# Patient Record
Sex: Female | Born: 1955 | Race: White | Hispanic: No | Marital: Single | Smoking: Current every day smoker
Health system: Southern US, Community
[De-identification: ages and names within clinical notes are randomized; demographics above are authoritative.]

## PROBLEM LIST (undated history)

## (undated) DIAGNOSIS — F329 Major depressive disorder, single episode, unspecified: Secondary | ICD-10-CM

## (undated) DIAGNOSIS — E785 Hyperlipidemia, unspecified: Secondary | ICD-10-CM

## (undated) DIAGNOSIS — F32A Depression, unspecified: Secondary | ICD-10-CM

## (undated) DIAGNOSIS — T7840XA Allergy, unspecified, initial encounter: Secondary | ICD-10-CM

## (undated) HISTORY — PX: TUBAL LIGATION: SHX77

## (undated) HISTORY — DX: Depression, unspecified: F32.A

## (undated) HISTORY — DX: Allergy, unspecified, initial encounter: T78.40XA

## (undated) HISTORY — DX: Major depressive disorder, single episode, unspecified: F32.9

## (undated) HISTORY — PX: WISDOM TOOTH EXTRACTION: SHX21

## (undated) HISTORY — DX: Hyperlipidemia, unspecified: E78.5

---

## 2009-03-05 ENCOUNTER — Encounter: Payer: Self-pay | Admitting: Family Medicine

## 2009-10-13 ENCOUNTER — Encounter: Payer: Self-pay | Admitting: Family Medicine

## 2009-10-13 LAB — CONVERTED CEMR LAB

## 2010-11-22 ENCOUNTER — Other Ambulatory Visit: Payer: Self-pay | Admitting: Family Medicine

## 2010-11-22 ENCOUNTER — Other Ambulatory Visit (HOSPITAL_COMMUNITY)
Admission: RE | Admit: 2010-11-22 | Discharge: 2010-11-22 | Disposition: A | Discharge status: Home / Self Care | Payer: Managed Care, Other (non HMO) | Source: Ambulatory Visit | Attending: Family Medicine | Admitting: Family Medicine

## 2010-11-22 ENCOUNTER — Ambulatory Visit (INDEPENDENT_AMBULATORY_CARE_PROVIDER_SITE_OTHER): Payer: Managed Care, Other (non HMO) | Admitting: Family Medicine

## 2010-11-22 ENCOUNTER — Encounter: Payer: Self-pay | Admitting: Family Medicine

## 2010-11-22 DIAGNOSIS — Z136 Encounter for screening for cardiovascular disorders: Secondary | ICD-10-CM

## 2010-11-22 DIAGNOSIS — Z1231 Encounter for screening mammogram for malignant neoplasm of breast: Secondary | ICD-10-CM

## 2010-11-22 DIAGNOSIS — Z124 Encounter for screening for malignant neoplasm of cervix: Secondary | ICD-10-CM | POA: Insufficient documentation

## 2010-11-22 DIAGNOSIS — F329 Major depressive disorder, single episode, unspecified: Secondary | ICD-10-CM | POA: Insufficient documentation

## 2010-11-22 DIAGNOSIS — Z0000 Encounter for general adult medical examination without abnormal findings: Secondary | ICD-10-CM

## 2010-11-22 DIAGNOSIS — F3289 Other specified depressive episodes: Secondary | ICD-10-CM | POA: Insufficient documentation

## 2010-11-22 DIAGNOSIS — F17200 Nicotine dependence, unspecified, uncomplicated: Secondary | ICD-10-CM | POA: Insufficient documentation

## 2010-11-22 DIAGNOSIS — J309 Allergic rhinitis, unspecified: Secondary | ICD-10-CM | POA: Insufficient documentation

## 2010-11-22 DIAGNOSIS — Z1159 Encounter for screening for other viral diseases: Secondary | ICD-10-CM | POA: Insufficient documentation

## 2010-11-22 LAB — BASIC METABOLIC PANEL
BUN: 5 mg/dL — ABNORMAL LOW (ref 6–23)
CO2: 28 mEq/L (ref 19–32)
Calcium: 8.7 mg/dL (ref 8.4–10.5)
Chloride: 101 mEq/L (ref 96–112)
Creatinine, Ser: 0.8 mg/dL (ref 0.4–1.2)
GFR: 85.92 mL/min (ref >60.00)
Glucose, Bld: 79 mg/dL (ref 70–99)
Potassium: 4 mEq/L (ref 3.5–5.1)
Sodium: 138 mEq/L (ref 135–145)

## 2010-11-22 LAB — LIPID PANEL
Cholesterol: 190 mg/dL (ref 0–200)
HDL: 49.2 mg/dL (ref >39.00)
LDL Cholesterol: 122 mg/dL — ABNORMAL HIGH (ref 0–99)
Total CHOL/HDL Ratio: 4
Triglycerides: 94 mg/dL (ref 0.0–149.0)
VLDL: 18.8 mg/dL (ref 0.0–40.0)

## 2010-11-22 LAB — HM MAMMOGRAPHY

## 2010-11-22 LAB — HM PAP SMEAR

## 2010-12-01 ENCOUNTER — Ambulatory Visit
Admission: RE | Admit: 2010-12-01 | Discharge: 2010-12-01 | Disposition: A | Discharge status: Home / Self Care | Payer: Managed Care, Other (non HMO) | Source: Ambulatory Visit | Attending: Family Medicine | Admitting: Family Medicine

## 2010-12-01 DIAGNOSIS — Z1231 Encounter for screening mammogram for malignant neoplasm of breast: Secondary | ICD-10-CM

## 2010-12-01 NOTE — Assessment & Plan Note (Signed)
Summary: new pt to est jrt   Vital Signs:  Patient profile:   55 year old female Height:      62 inches Weight:      182.50 pounds BMI:     33.50 Temp:     98.4 degrees F oral Pulse rate:   82 / minute Pulse rhythm:   regular BP sitting:   110 / 70  (left arm) Cuff size:   large  Vitals Entered By: Kimberly Christian Kimberly Christian) (12/17/10 9:47 AM) CC: new patient, establish care   History of Present Illness: 55 yo here to establish care and for CPX.  Move from [facility] in July to be closer to family to help take care of her ailing husband who died in 06-01-23.  Well woman- h/o abnormal pap smears in past, pap smear last January was normal per pt's report. Mammograms have been normal but she did have an area in her right breast they were watching. Has never had a colonoscopy.  Tobacco abuse- 1 ppd x 27 years.  Quit once using Chantix for 1 year but started smoking again after her husband died.  Would like to try Chantix again at some point but currently not ready yet.  Depression- has been stable on Celexa 40 mg daily since 28-Feb-1997 when her son died.  She is happier than she has been in a long time, dating another man.  Denies any signs or symptoms of anxiety or depression.      Preventive Screening-Counseling & Management  Alcohol-Tobacco     Smoking Status: current      Drug Use:  no.    Current Medications (verified): 1)  Celexa 40 Mg Tabs (Citalopram Hydrobromide) .... Take One Tablet By Mouth Daily  Allergies (verified): No Known Drug Allergies  Past History:  Family History: Last updated: 2010/12/17 Mother died of Lung CA at 21, heavy smoker Father died at 16 of pulmonary fibrosis, GI bleed.  Social History: Last updated: 2010-12-17 Widow/Widower Domestic Partner Current Smoker Alcohol use-yes Drug use-no  Risk Factors: Smoking Status: current (17-Dec-2010)  Past Medical History: Allergic rhinitis Depression  Past Surgical History: Tubal  ligation  Family History: Mother died of Lung CA at 13, heavy smoker Father died at 78 of pulmonary fibrosis, GI bleed.  Social History: Widow/Widower Media planner Current Smoker Alcohol use-yes Drug use-no Smoking Status:  current Drug Use:  no  Review of Systems      See HPI General:  Denies malaise. Eyes:  Denies blurring. ENT:  Denies difficulty swallowing. CV:  Denies chest pain or discomfort. Resp:  Denies shortness of breath. GI:  Denies abdominal pain, bloody stools, and change in bowel habits. GU:  Denies abnormal vaginal bleeding, dysuria, and urinary frequency. MS:  Denies joint pain, joint redness, and joint swelling. Derm:  Denies rash. Neuro:  Denies headaches. Psych:  Denies anxiety and depression. Endo:  Denies cold intolerance and heat intolerance. Heme:  Denies abnormal bruising and bleeding.  Physical Exam  General:  alert, well-developed, and overweight-appearing.   Head:  normocephalic, atraumatic, no abnormalities observed, and no abnormalities palpated.   Eyes:  vision grossly intact, pupils equal, pupils round, and pupils reactive to light.   Ears:  R ear normal and L ear normal.   Nose:  no external deformity.   Mouth:  good dentition.   Neck:  No deformities, masses, or tenderness noted. Breasts:  No mass, nodules, thickening, tenderness, bulging, retraction, inflamation, nipple discharge or skin changes noted.  Lungs:  Normal respiratory effort, chest expands symmetrically. Lungs are clear to auscultation, no crackles or wheezes. Heart:  Normal rate and regular rhythm. S1 and S2 normal without gallop, murmur, click, rub or other extra sounds. Abdomen:  Bowel sounds positive,abdomen soft and non-tender without masses, organomegaly or hernias noted. Rectal:  no external abnormalities.   Genitalia:  Pelvic Exam:        External: normal female genitalia without lesions or masses        Vagina: normal without lesions or masses        Cervix:  normal without lesions or masses        Adnexa: normal bimanual exam without masses or fullness        Uterus: normal by palpation        Pap smear: performed Msk:  No deformity or scoliosis noted of thoracic or lumbar spine.   Extremities:  No clubbing, cyanosis, edema, or deformity noted with normal full range of motion of all joints.   Neurologic:  alert & oriented X3 and gait normal.   Skin:  solar damage and seborrheic keratosis.   Psych:  Oriented X3, memory intact for recent and remote, normally interactive, not anxious appearing, and not depressed appearing.     Impression & Recommendations:  Problem # 1:  PREVENTIVE HEALTH CARE (ICD-V70.0) Reviewed preventive care protocols, scheduled due services, and updated immunizations Discussed nutrition, exercise, diet, and healthy lifestyle.  Set up mammogram today. Pap smear today. Discussed colonscopy, pt will think about it and get back to me.  If she decides not to do it, will order stool cards. BMET, FLP. Orders: TLB-BMP (Basic Metabolic Panel-BMET) (80048-METABOL)  Problem # 2:  TOBACCO ABUSE (ICD-305.1) Assessment: Unchanged she will let me know when she is ready to quit. will retry Chantix.  Problem # 3:  DEPRESSION (ICD-311) Assessment: Improved Stable on Celexa 40 mg daily. Her updated medication list for this problem includes:    Celexa 40 Mg Tabs (Citalopram hydrobromide) .Marland Kitchen... Take one tablet by mouth daily  Complete Medication List: 1)  Celexa 40 Mg Tabs (Citalopram hydrobromide) .... Take one tablet by mouth daily  Other Orders: Venipuncture (11914) TLB-Lipid Panel (80061-LIPID) Radiology Referral (Radiology)  Patient Instructions: 1)  Nice to meet you. 2)  Please stop by to see Kimberly Christian on your way out to set up your mammogram. Prescriptions: CELEXA 40 MG TABS (CITALOPRAM HYDROBROMIDE) take one tablet by mouth daily  #90 x 3   Entered and Authorized by:   Kimberly Mannan MD   Signed by:   Kimberly Mannan MD on  11/22/2010   Method used:   Electronically to        [id number] (retail)       [address] [place], [place]  [address]       Ph: [phone]       Fax: [phone]   RxID:   [id number]    Orders Added: 1)  Venipuncture [40347] 2)  TLB-Lipid Panel [80061-LIPID] 3)  Radiology Referral [Radiology] 4)  TLB-BMP (Basic Metabolic Panel-BMET) [80048-METABOL] 5)  New Patient 40-64 years [99386]    Current Allergies (reviewed today): No known allergies   Prevention & Chronic Care Immunizations   Influenza vaccine: Not documented    Tetanus booster: Not documented    Pneumococcal vaccine: Not documented  Colorectal Screening   Hemoccult: Not documented    Colonoscopy: Not documented  Other Screening   Pap smear: historical  (10/13/2009)   Pap smear action/deferral: Ordered  (11/22/2010)   Pap smear due: 10/13/2010    Mammogram: historical  (10/13/2009)   Mammogram action/deferral: Ordered  (11/22/2010)   Mammogram due: 10/13/2010   Smoking status: current  (11/22/2010)  Lipids   Total Cholesterol: Not documented   Lipid panel action/deferral: LDL Direct Ordered   LDL: Not documented   LDL Direct: Not documented   HDL: Not documented   Triglycerides: Not documented   Nursing Instructions: Pap smear today Schedule screening mammogram (see order)   PAP Result Date:  10/13/2009 PAP Result:  historical Mammogram Result Date:  10/13/2009 Mammogram Result:  historical

## 2010-12-06 NOTE — Letter (Signed)
Summary: Records from Dr. Roland Rack 2009 - 2010  Records from Dr. Roland Rack 2009 - 2010   Imported By: Maryln Gottron 11/30/2010 13:03:39  _____________________________________________________________________  External Attachment:    Type:   Image     Comment:   External Document

## 2011-02-06 ENCOUNTER — Encounter: Payer: Self-pay | Admitting: Family Medicine

## 2011-02-06 ENCOUNTER — Ambulatory Visit (INDEPENDENT_AMBULATORY_CARE_PROVIDER_SITE_OTHER): Payer: Managed Care, Other (non HMO) | Admitting: Family Medicine

## 2011-02-06 VITALS — BP 110/70 | HR 83 | Temp 98.6°F | Wt 181.0 lb

## 2011-02-06 DIAGNOSIS — H109 Unspecified conjunctivitis: Secondary | ICD-10-CM

## 2011-02-06 MED ORDER — ERYTHROMYCIN 5 MG/GM OP OINT: TOPICAL_OINTMENT | OPHTHALMIC | Status: DC

## 2011-02-06 NOTE — Progress Notes (Signed)
Subjective:    Kimberly Christian is a 55 y.o. female who presents for evaluation of discharge, pain and lower lid swelling  in the right eye. She has noticed the above symptoms for 2 days. Onset was gradual. Patient denies blurred vision, foreign body sensation, itching, pain and photophobia. There is a history of allergies.  The PMH, PSH, Social History, Family History, Medications, and allergies have been reviewed in [facility], and have been updated if relevant.   Review of Systems Pertinent items are noted in HPI.   Objective:    There were no vitals taken for this visit.      General: alert and cooperative  Eyes:  positive findings: eyelids/periorbital: blepharitis on the right, some thickened discharge on right eye lashes, right conjunctival injection. Mild erythema of lower lid but no warmth or other signs of cellulitis. Left eye normal.  Vision: Not performed  Fluorescein:  not done     Assessment:    Acute conjunctivitis and Blepharitis   Plan:    Discussed the diagnosis and proper care of conjunctivitis.  Stressed household Presenter, broadcasting. Ophthalmic drops per orders. Warm compress to eye(s). Local eye care discussed.  If erythema spreads or develops any red flag symptoms, such as red eye, blurred vision, photophobia or fever, pt to go to ER or call us immediately. The patient indicates understanding of these issues and agrees with the plan.

## 2011-12-04 ENCOUNTER — Other Ambulatory Visit: Payer: Self-pay | Admitting: *Deleted

## 2011-12-04 MED ORDER — CITALOPRAM HYDROBROMIDE 40 MG PO TABS: 40.0000 mg | ORAL_TABLET | Freq: Every day | ORAL | Status: DC

## 2011-12-04 NOTE — Telephone Encounter (Signed)
Citalopram sent to [facility] in error, I cancelled script there, resent to [facility].

## 2011-12-27 ENCOUNTER — Ambulatory Visit (INDEPENDENT_AMBULATORY_CARE_PROVIDER_SITE_OTHER): Payer: Self-pay | Admitting: Family Medicine

## 2011-12-27 ENCOUNTER — Other Ambulatory Visit (HOSPITAL_COMMUNITY)
Admission: RE | Admit: 2011-12-27 | Discharge: 2011-12-27 | Disposition: A | Discharge status: Home / Self Care | Payer: Managed Care, Other (non HMO) | Source: Ambulatory Visit | Attending: Family Medicine | Admitting: Family Medicine

## 2011-12-27 ENCOUNTER — Encounter: Payer: Self-pay | Admitting: Family Medicine

## 2011-12-27 VITALS — BP 122/86 | HR 72 | Temp 98.3°F | Wt 186.0 lb

## 2011-12-27 DIAGNOSIS — Z0000 Encounter for general adult medical examination without abnormal findings: Secondary | ICD-10-CM

## 2011-12-27 DIAGNOSIS — Z124 Encounter for screening for malignant neoplasm of cervix: Secondary | ICD-10-CM

## 2011-12-27 DIAGNOSIS — Z23 Encounter for immunization: Secondary | ICD-10-CM

## 2011-12-27 DIAGNOSIS — Z01419 Encounter for gynecological examination (general) (routine) without abnormal findings: Secondary | ICD-10-CM | POA: Insufficient documentation

## 2011-12-27 DIAGNOSIS — F17200 Nicotine dependence, unspecified, uncomplicated: Secondary | ICD-10-CM

## 2011-12-27 DIAGNOSIS — F3289 Other specified depressive episodes: Secondary | ICD-10-CM

## 2011-12-27 DIAGNOSIS — F329 Major depressive disorder, single episode, unspecified: Secondary | ICD-10-CM

## 2011-12-27 DIAGNOSIS — Z136 Encounter for screening for cardiovascular disorders: Secondary | ICD-10-CM

## 2011-12-27 DIAGNOSIS — Z1211 Encounter for screening for malignant neoplasm of colon: Secondary | ICD-10-CM

## 2011-12-27 DIAGNOSIS — Z1231 Encounter for screening mammogram for malignant neoplasm of breast: Secondary | ICD-10-CM

## 2011-12-27 LAB — COMPREHENSIVE METABOLIC PANEL
ALT: 30 U/L (ref 0–35)
AST: 29 U/L (ref 0–37)
Albumin: 3.7 g/dL (ref 3.5–5.2)
Alkaline Phosphatase: 82 U/L (ref 39–117)
BUN: 5 mg/dL — ABNORMAL LOW (ref 6–23)
CO2: 27 mEq/L (ref 19–32)
Calcium: 9 mg/dL (ref 8.4–10.5)
Chloride: 103 mEq/L (ref 96–112)
Creatinine, Ser: 0.8 mg/dL (ref 0.4–1.2)
GFR: 83 mL/min (ref >60.00)
Glucose, Bld: 98 mg/dL (ref 70–99)
Potassium: 4.6 mEq/L (ref 3.5–5.1)
Sodium: 140 mEq/L (ref 135–145)
Total Bilirubin: 0.6 mg/dL (ref 0.3–1.2)
Total Protein: 6.9 g/dL (ref 6.0–8.3)

## 2011-12-27 LAB — LIPID PANEL
Cholesterol: 195 mg/dL (ref 0–200)
HDL: 45.3 mg/dL (ref >39.00)
LDL Cholesterol: 130 mg/dL — ABNORMAL HIGH (ref 0–99)
Total CHOL/HDL Ratio: 4
Triglycerides: 98 mg/dL (ref 0.0–149.0)
VLDL: 19.6 mg/dL (ref 0.0–40.0)

## 2011-12-27 NOTE — Patient Instructions (Signed)
Good to see you. Please stop by to see Shirlee Limerick on your way out to set up your mammogram. We will call you with your lab results. Try taking the Nexium - 1 tablet daily.  Call me in a couple of weeks with an update.

## 2011-12-27 NOTE — Progress Notes (Signed)
Addended by: Eliezer Bottom on: 12/27/2011 10:02 AM   Modules accepted: Orders

## 2011-12-27 NOTE — Progress Notes (Signed)
56  yo here for CPX.  She has had a difficult year, but she feels she is coping well. She lost her job and husband diagnosed with early prostate CA- he is doing well.  Well woman- h/o abnormal pap smears in past, due for pap smear today. Due for Tdap. Due for colonoscopy but she would like to defer and check IFOB as well.  Depression- has been stable on Celexa 40 mg daily since 1997/03/14 when her son died.  Denies any signs or symptoms of anxiety or depression even with recent stressor.   Tobacco abuse- she had quit using Chantix but restarted when her husband was diagnosed with cancer. Not quite ready to quit again.  Patient Active Problem List  Diagnoses  . TOBACCO ABUSE  . DEPRESSION  . ALLERGIC RHINITIS  . Routine general medical examination at a health care facility   Past Medical History  Diagnosis Date  . Allergy   . Depression    Past Surgical History  Procedure Date  . Tubal ligation    History  Substance Use Topics  . Smoking status: Current Everyday Smoker  . Smokeless tobacco: Not on file  . Alcohol Use: Yes   Family History  Problem Relation Age of Onset  . Cancer Mother     lung  . Pulmonary fibrosis Father    No Known Allergies Current Outpatient Prescriptions on File Prior to Visit  Medication Sig Dispense Refill  . citalopram (CELEXA) 40 MG tablet Take 1 tablet (40 mg total) by mouth daily.  90 tablet  0  . citalopram (CELEXA) 40 MG tablet Take 1 tablet (40 mg total) by mouth daily.  90 tablet  0  . erythromycin ophthalmic ointment Instill 1/2 inch four times a day x 7 days  3.5 g  0   The PMH, PSH, Social History, Family History, Medications, and allergies have been reviewed in [facility], and have been updated if relevant.  ROS: See HPI Patient reports no  vision/ hearing changes,anorexia, weight change, fever ,adenopathy, persistant / recurrent hoarseness, swallowing issues, chest pain, edema,persistant / recurrent cough, hemoptysis, dyspnea(rest,  exertional, paroxysmal nocturnal), gastrointestinal  bleeding (melena, rectal bleeding), abdominal pain, excessive heart burn, GU symptoms(dysuria, hematuria, pyuria, voiding/incontinence  Issues) syncope, focal weakness, severe memory loss, concerning skin lesions, depression, anxiety, abnormal bruising/bleeding, major joint swelling, breast masses or abnormal vaginal bleeding.    Physical exam: BP 122/86  Pulse 72  Temp(Src) 98.3 F (36.8 C) (Oral)  Wt 186 lb (84.369 kg)  General:  Well-developed,well-nourished,in no acute distress; alert,appropriate and cooperative throughout examination Head:  normocephalic and atraumatic.   Eyes:  vision grossly intact, pupils equal, pupils round, and pupils reactive to light.   Ears:  R ear normal and L ear normal.   Nose:  no external deformity.   Mouth:  good dentition.   Neck:  No deformities, masses, or tenderness noted. Breasts:  No mass, nodules, thickening, tenderness, bulging, retraction, inflamation, nipple discharge or skin changes noted.   Lungs:  Normal respiratory effort, chest expands symmetrically. Lungs are clear to auscultation, no crackles or wheezes. Heart:  Normal rate and regular rhythm. S1 and S2 normal without gallop, murmur, click, rub or other extra sounds. Abdomen:  Bowel sounds positive,abdomen soft and non-tender without masses, organomegaly or hernias noted. Rectal:  no external abnormalities.   Genitalia:  Pelvic Exam:        External: normal female genitalia without lesions or masses        Vagina: normal without  lesions or masses        Cervix: normal without lesions or masses        Adnexa: normal bimanual exam without masses or fullness        Uterus: normal by palpation        Pap smear: performed Msk:  No deformity or scoliosis noted of thoracic or lumbar spine.   Extremities:  No clubbing, cyanosis, edema, or deformity noted with normal full range of motion of all joints.   Neurologic:  alert & oriented X3 and  gait normal.   Skin:  Intact without suspicious lesions or rashes Cervical Nodes:  No lymphadenopathy noted Axillary Nodes:  No palpable lymphadenopathy Psych:  Cognition and judgment appear intact. Alert and cooperative with normal attention span and concentration. No apparent delusions, illusions, hallucinations  Assessment and Plan: 1. Routine general medical examination at a health care facility   Reviewed preventive care protocols, scheduled due services, and updated immunizations Discussed nutrition, exercise, diet, and healthy lifestyle.  Comprehensive metabolic panel Fecal occult blood, imunochemical MM Digital Diagnostic Bilat Lipid Panel  2. DEPRESSION  Stable, continue current dose of Celex.   3. TOBACCO ABUSE  Deteriorated.  Not yet ready to quit again.    4. Routine gynecological examination  Cytology - PAP

## 2011-12-27 NOTE — Progress Notes (Signed)
Addended by: Dianne Dun on: 12/27/2011 10:01 AM   Modules accepted: Orders

## 2011-12-29 ENCOUNTER — Encounter: Payer: Self-pay | Admitting: *Deleted

## 2011-12-29 LAB — HM PAP SMEAR: HM Pap smear: NORMAL

## 2012-01-18 ENCOUNTER — Ambulatory Visit
Admission: RE | Admit: 2012-01-18 | Discharge: 2012-01-18 | Disposition: A | Discharge status: Home / Self Care | Payer: Managed Care, Other (non HMO) | Source: Ambulatory Visit | Attending: Family Medicine | Admitting: Family Medicine

## 2012-01-18 DIAGNOSIS — Z1231 Encounter for screening mammogram for malignant neoplasm of breast: Secondary | ICD-10-CM

## 2012-01-23 ENCOUNTER — Encounter: Payer: Self-pay | Admitting: *Deleted

## 2012-01-23 ENCOUNTER — Encounter: Payer: Self-pay | Admitting: Family Medicine

## 2012-01-23 LAB — HM MAMMOGRAPHY: HM Mammogram: NORMAL

## 2012-04-22 ENCOUNTER — Other Ambulatory Visit: Payer: Self-pay | Admitting: *Deleted

## 2012-04-22 MED ORDER — CITALOPRAM HYDROBROMIDE 40 MG PO TABS: 40.0000 mg | ORAL_TABLET | Freq: Every day | ORAL | Status: DC

## 2012-08-02 ENCOUNTER — Other Ambulatory Visit: Payer: Self-pay | Admitting: *Deleted

## 2012-08-02 MED ORDER — CITALOPRAM HYDROBROMIDE 40 MG PO TABS: 40.0000 mg | ORAL_TABLET | Freq: Every day | ORAL | Status: DC

## 2012-09-16 ENCOUNTER — Encounter: Payer: Self-pay | Admitting: Family Medicine

## 2012-09-16 ENCOUNTER — Ambulatory Visit: Payer: Managed Care, Other (non HMO) | Admitting: Family Medicine

## 2012-09-16 ENCOUNTER — Ambulatory Visit (INDEPENDENT_AMBULATORY_CARE_PROVIDER_SITE_OTHER): Payer: BC Managed Care – PPO | Admitting: Family Medicine

## 2012-09-16 VITALS — BP 132/80 | HR 86 | Temp 98.4°F | Wt 198.0 lb

## 2012-09-16 DIAGNOSIS — R0789 Other chest pain: Secondary | ICD-10-CM

## 2012-09-16 DIAGNOSIS — R071 Chest pain on breathing: Secondary | ICD-10-CM

## 2012-09-16 MED ORDER — METHOCARBAMOL 500 MG PO TABS: 500.0000 mg | ORAL_TABLET | Freq: Four times a day (QID) | ORAL | Status: DC

## 2012-09-16 MED ORDER — HYDROCODONE-ACETAMINOPHEN 5-325 MG PO TABS: 1.0000 | ORAL_TABLET | Freq: Four times a day (QID) | ORAL | Status: DC | PRN

## 2012-09-16 NOTE — Progress Notes (Signed)
Fell 09/06/12.  ~6" step down, pulling a bag on wheels behind her with her R arm.  Was stepping onto gravel.  Larey Seat forward, she didn't leg go of the bag. She didn't hit her shoulder.  Pain in R ant/posterior chest wall.  Occ R arm tingling.  Pain all the time, sometimes worse than others.  Worse in AM.  Trouble sleeping, waking from the pain.  No LOC with the fall. Fell onto her L side.  R arm was trailing her at the time.    Pain started about 2 days after the fall.  She's getting worse daily.  Taking ibuprofen and aleve w/o relief.    Meds, vitals, and allergies reviewed.   ROS: See HPI.  Otherwise, noncontributory.  nad ncat Normal neck rom Neck not ttp, no midline back pain No arm drop and no cuff impingement on R shoulder AC not ttp on testing Anterior and posterior chest wall ttp w/o brusing S/S/DTR wnl for BUE Distally with normal radial pulse B

## 2012-09-16 NOTE — Patient Instructions (Addendum)
Take ibuprofen 800mg  twice a day with food.  Take robaxin for the muscle tightness and vicodin for pain.  Sedation and constipation caution.  Take care.  This should slowly improve.

## 2012-09-16 NOTE — Assessment & Plan Note (Signed)
Not improved with only ibuprofen.  Continue ibuprofen with GI caution, add on robaxin for the muscle tightness and vicodin for pain. Sedation and constipation caution.  F/u prn.  No need to image today.

## 2013-01-17 ENCOUNTER — Other Ambulatory Visit: Payer: Self-pay

## 2013-01-17 DIAGNOSIS — Z1231 Encounter for screening mammogram for malignant neoplasm of breast: Secondary | ICD-10-CM

## 2013-01-17 MED ORDER — CITALOPRAM HYDROBROMIDE 40 MG PO TABS: 40.0000 mg | ORAL_TABLET | Freq: Every day | ORAL | Status: DC

## 2013-01-17 NOTE — Telephone Encounter (Signed)
Refill sent to pharmacy, left message advising patient.

## 2013-01-17 NOTE — Telephone Encounter (Signed)
OK to refill but no further refills if she does not come to her CPX.

## 2013-01-17 NOTE — Telephone Encounter (Signed)
Pt left v/m requesting refill celexa to [facility]; pt has already received one refill with note to have office visit before further refills. Pt has CPX scheduled 02/13/13.Please advise.pt request call back.

## 2013-01-23 ENCOUNTER — Other Ambulatory Visit: Payer: Self-pay | Admitting: Family Medicine

## 2013-01-23 DIAGNOSIS — Z0000 Encounter for general adult medical examination without abnormal findings: Secondary | ICD-10-CM

## 2013-01-23 DIAGNOSIS — Z136 Encounter for screening for cardiovascular disorders: Secondary | ICD-10-CM

## 2013-02-03 ENCOUNTER — Other Ambulatory Visit (INDEPENDENT_AMBULATORY_CARE_PROVIDER_SITE_OTHER): Payer: BC Managed Care – PPO

## 2013-02-03 DIAGNOSIS — Z0000 Encounter for general adult medical examination without abnormal findings: Secondary | ICD-10-CM

## 2013-02-03 DIAGNOSIS — Z136 Encounter for screening for cardiovascular disorders: Secondary | ICD-10-CM

## 2013-02-03 LAB — CBC WITH DIFFERENTIAL/PLATELET
Basophils Absolute: 0.1 10*3/uL (ref 0.0–0.1)
Basophils Relative: 1.2 % (ref 0.0–3.0)
Eosinophils Absolute: 0.2 10*3/uL (ref 0.0–0.7)
Eosinophils Relative: 1.4 % (ref 0.0–5.0)
HCT: 45.9 % (ref 36.0–46.0)
Hemoglobin: 15.7 g/dL — ABNORMAL HIGH (ref 12.0–15.0)
Lymphocytes Relative: 26.8 % (ref 12.0–46.0)
Lymphs Abs: 2.9 10*3/uL (ref 0.7–4.0)
MCHC: 34.3 g/dL (ref 30.0–36.0)
MCV: 100.7 fl — ABNORMAL HIGH (ref 78.0–100.0)
Monocytes Absolute: 0.6 10*3/uL (ref 0.1–1.0)
Monocytes Relative: 5.9 % (ref 3.0–12.0)
Neutro Abs: 6.9 10*3/uL (ref 1.4–7.7)
Neutrophils Relative %: 64.7 % (ref 43.0–77.0)
Platelets: 234 10*3/uL (ref 150.0–400.0)
RBC: 4.56 Mil/uL (ref 3.87–5.11)
RDW: 13.8 % (ref 11.5–14.6)
WBC: 10.7 10*3/uL — ABNORMAL HIGH (ref 4.5–10.5)

## 2013-02-03 LAB — COMPREHENSIVE METABOLIC PANEL
ALT: 38 U/L — ABNORMAL HIGH (ref 0–35)
AST: 40 U/L — ABNORMAL HIGH (ref 0–37)
Albumin: 3.6 g/dL (ref 3.5–5.2)
Alkaline Phosphatase: 76 U/L (ref 39–117)
BUN: 6 mg/dL (ref 6–23)
CO2: 27 mEq/L (ref 19–32)
Calcium: 8.8 mg/dL (ref 8.4–10.5)
Chloride: 104 mEq/L (ref 96–112)
Creatinine, Ser: 0.9 mg/dL (ref 0.4–1.2)
GFR: 66.48 mL/min (ref >60.00)
Glucose, Bld: 97 mg/dL (ref 70–99)
Potassium: 4.1 mEq/L (ref 3.5–5.1)
Sodium: 140 mEq/L (ref 135–145)
Total Bilirubin: 0.4 mg/dL (ref 0.3–1.2)
Total Protein: 7.1 g/dL (ref 6.0–8.3)

## 2013-02-03 LAB — LIPID PANEL
Cholesterol: 211 mg/dL — ABNORMAL HIGH (ref 0–200)
HDL: 36.5 mg/dL — ABNORMAL LOW (ref >39.00)
Total CHOL/HDL Ratio: 6
Triglycerides: 154 mg/dL — ABNORMAL HIGH (ref 0.0–149.0)
VLDL: 30.8 mg/dL (ref 0.0–40.0)

## 2013-02-03 LAB — LDL CHOLESTEROL, DIRECT: Direct LDL: 150.3 mg/dL

## 2013-02-10 ENCOUNTER — Ambulatory Visit
Admission: RE | Admit: 2013-02-10 | Discharge: 2013-02-10 | Disposition: A | Discharge status: Home / Self Care | Payer: BC Managed Care – PPO | Source: Ambulatory Visit

## 2013-02-10 DIAGNOSIS — Z1231 Encounter for screening mammogram for malignant neoplasm of breast: Secondary | ICD-10-CM

## 2013-02-12 ENCOUNTER — Encounter: Payer: Self-pay | Admitting: Radiology

## 2013-02-13 ENCOUNTER — Encounter: Payer: Self-pay | Admitting: Family Medicine

## 2013-02-13 ENCOUNTER — Encounter: Payer: Self-pay | Admitting: Internal Medicine

## 2013-02-13 ENCOUNTER — Ambulatory Visit (INDEPENDENT_AMBULATORY_CARE_PROVIDER_SITE_OTHER): Payer: BC Managed Care – PPO | Admitting: Family Medicine

## 2013-02-13 VITALS — BP 130/84 | HR 72 | Temp 98.3°F | Ht 61.0 in | Wt 201.0 lb

## 2013-02-13 DIAGNOSIS — F329 Major depressive disorder, single episode, unspecified: Secondary | ICD-10-CM

## 2013-02-13 DIAGNOSIS — Z0000 Encounter for general adult medical examination without abnormal findings: Secondary | ICD-10-CM

## 2013-02-13 DIAGNOSIS — Z1211 Encounter for screening for malignant neoplasm of colon: Secondary | ICD-10-CM

## 2013-02-13 DIAGNOSIS — E785 Hyperlipidemia, unspecified: Secondary | ICD-10-CM | POA: Insufficient documentation

## 2013-02-13 DIAGNOSIS — F3289 Other specified depressive episodes: Secondary | ICD-10-CM

## 2013-02-13 DIAGNOSIS — J309 Allergic rhinitis, unspecified: Secondary | ICD-10-CM

## 2013-02-13 DIAGNOSIS — F17200 Nicotine dependence, unspecified, uncomplicated: Secondary | ICD-10-CM

## 2013-02-13 MED ORDER — SIMVASTATIN 10 MG PO TABS: 10.0000 mg | ORAL_TABLET | Freq: Every day | ORAL | Status: DC

## 2013-02-13 NOTE — Patient Instructions (Addendum)
Good to see you. Please stop by to see Kimberly Christian on your way out to set up your referral for colonoscopy.  We are starting Simvastatin 10 mg nightly.  Please come in for labs in 8 weeks.  Let me know how I can help you quit smoking.

## 2013-02-13 NOTE — Progress Notes (Signed)
57 yo pleasant female here for CPX.  Last pap smear normal -12/27/2011 (done by me). No h/o abnormal pap smears.  Had mammogram done on Monday (normal).  Due for colonoscopy -she is willing to have this done now.  Depression- has been stable on Celexa 40 mg daily since 02/04/97 when her son died.  Denies any signs or symptoms of anxiety or depression even with recent stressor.   Tobacco abuse- she had quit using Chantix but restarted when her husband was diagnosed with cancer.  He is doing very well. Not quite ready to quit again.  HLD- new- does have family h/o HLD. Lab Results  Component Value Date   CHOL 211* 02/03/2013   HDL 36.50* 02/03/2013   LDLCALC 130* 12/27/2011   LDLDIRECT 150.3 02/03/2013   TRIG 154.0* 02/03/2013   CHOLHDL 6 02/03/2013   Lab Results  Component Value Date   NA 140 02/03/2013   K 4.1 02/03/2013   CL 104 02/03/2013   CO2 27 02/03/2013   Lab Results  Component Value Date   CREATININE 0.9 02/03/2013     Patient Active Problem List   Diagnosis Date Noted  . Routine general medical examination at a health care facility 12/27/2011  . Routine gynecological examination 12/27/2011  . TOBACCO ABUSE 11/22/2010  . DEPRESSION 11/22/2010  . ALLERGIC RHINITIS 11/22/2010   Past Medical History  Diagnosis Date  . Allergy   . Depression    Past Surgical History  Procedure Laterality Date  . Tubal ligation     History  Substance Use Topics  . Smoking status: Current Every Day Smoker  . Smokeless tobacco: Not on file  . Alcohol Use: Yes   Family History  Problem Relation Age of Onset  . Cancer Mother     lung  . Pulmonary fibrosis Father    No Known Allergies Current Outpatient Prescriptions on File Prior to Visit  Medication Sig Dispense Refill  . citalopram (CELEXA) 40 MG tablet Take 1 tablet (40 mg total) by mouth daily.  90 tablet  0  . HYDROcodone-acetaminophen (NORCO/VICODIN) 5-325 MG per tablet Take 1 tablet by mouth every 6 (six) hours as needed for  pain.  30 tablet  0  . methocarbamol (ROBAXIN) 500 MG tablet Take 1 tablet (500 mg total) by mouth 4 (four) times daily.  40 tablet  1   No current facility-administered medications on file prior to visit.   The PMH, PSH, Social History, Family History, Medications, and allergies have been reviewed in [facility], and have been updated if relevant.  ROS: See HPI Patient reports no  vision/ hearing changes,anorexia, weight change, fever ,adenopathy, persistant / recurrent hoarseness, swallowing issues, chest pain, edema,persistant / recurrent cough, hemoptysis, dyspnea(rest, exertional, paroxysmal nocturnal), gastrointestinal  bleeding (melena, rectal bleeding), abdominal pain, excessive heart burn, GU symptoms(dysuria, hematuria, pyuria, voiding/incontinence  Issues) syncope, focal weakness, severe memory loss, concerning skin lesions, depression, anxiety, abnormal bruising/bleeding, major joint swelling, breast masses or abnormal vaginal bleeding.    Physical exam: There were no vitals taken for this visit.  General:  Well-developed,well-nourished,in no acute distress; alert,appropriate and cooperative throughout examination Head:  normocephalic and atraumatic.   Eyes:  vision grossly intact, pupils equal, pupils round, and pupils reactive to light.   Ears:  R ear normal and L ear normal.   Nose:  no external deformity.   Mouth:  good dentition.   Lungs:  Normal respiratory effort, chest expands symmetrically. Lungs are clear to auscultation, no crackles or wheezes. Heart:  Normal rate and regular rhythm. S1 and S2 normal without gallop, murmur, click, rub or other extra sounds. Abdomen:  Bowel sounds positive,abdomen soft and non-tender without masses, organomegaly or hernias noted. Msk:  No deformity or scoliosis noted of thoracic or lumbar spine.   Extremities:  No clubbing, cyanosis, edema, or deformity noted with normal full range of motion of all joints.   Neurologic:  alert & oriented X3 and  gait normal.   Skin:  Intact without suspicious lesions or rashes Cervical Nodes:  No lymphadenopathy noted Axillary Nodes:  No palpable lymphadenopathy Psych:  Cognition and judgment appear intact. Alert and cooperative with normal attention span and concentration. No apparent delusions, illusions, hallucinations  Assessment and Plan:  1. Routine general medical examination at a health care facility Reviewed preventive care protocols, scheduled due services, and updated immunizations Discussed nutrition, exercise, diet, and healthy lifestyle.   2. ALLERGIC RHINITIS Well controlled.  3. DEPRESSION Stable on current dose of Celexa.  4. TOBACCO ABUSE Not ready to quit.  5. Special screening for malignant neoplasms, colon  - Ambulatory referral to Gastroenterology  6. HLD (hyperlipidemia) New- simvastatin 10 mg daily.  Follow up labs in 8 weeks.  Given low cholesterol diet handout. The patient indicates understanding of these issues and agrees with the plan.  - Comprehensive metabolic panel; Future - Lipid Panel; Future

## 2013-04-10 ENCOUNTER — Ambulatory Visit (AMBULATORY_SURGERY_CENTER): Payer: BC Managed Care – PPO

## 2013-04-10 ENCOUNTER — Other Ambulatory Visit (INDEPENDENT_AMBULATORY_CARE_PROVIDER_SITE_OTHER): Payer: BC Managed Care – PPO

## 2013-04-10 VITALS — Ht 61.5 in | Wt 197.4 lb

## 2013-04-10 DIAGNOSIS — Z1211 Encounter for screening for malignant neoplasm of colon: Secondary | ICD-10-CM

## 2013-04-10 DIAGNOSIS — E785 Hyperlipidemia, unspecified: Secondary | ICD-10-CM

## 2013-04-10 LAB — COMPREHENSIVE METABOLIC PANEL
ALT: 45 U/L — ABNORMAL HIGH (ref 0–35)
AST: 45 U/L — ABNORMAL HIGH (ref 0–37)
Albumin: 3.7 g/dL (ref 3.5–5.2)
Alkaline Phosphatase: 90 U/L (ref 39–117)
BUN: 5 mg/dL — ABNORMAL LOW (ref 6–23)
CO2: 29 mEq/L (ref 19–32)
Calcium: 9 mg/dL (ref 8.4–10.5)
Chloride: 104 mEq/L (ref 96–112)
Creatinine, Ser: 0.9 mg/dL (ref 0.4–1.2)
GFR: 66.43 mL/min (ref >60.00)
Glucose, Bld: 93 mg/dL (ref 70–99)
Potassium: 4.1 mEq/L (ref 3.5–5.1)
Sodium: 140 mEq/L (ref 135–145)
Total Bilirubin: 0.6 mg/dL (ref 0.3–1.2)
Total Protein: 7.1 g/dL (ref 6.0–8.3)

## 2013-04-10 LAB — LIPID PANEL
Cholesterol: 202 mg/dL — ABNORMAL HIGH (ref 0–200)
HDL: 38.2 mg/dL — ABNORMAL LOW (ref >39.00)
Total CHOL/HDL Ratio: 5
Triglycerides: 236 mg/dL — ABNORMAL HIGH (ref 0.0–149.0)
VLDL: 47.2 mg/dL — ABNORMAL HIGH (ref 0.0–40.0)

## 2013-04-10 LAB — LDL CHOLESTEROL, DIRECT: Direct LDL: 143.8 mg/dL

## 2013-04-10 MED ORDER — MOVIPREP 100 G PO SOLR: ORAL | Status: DC

## 2013-04-11 ENCOUNTER — Encounter: Payer: Self-pay | Admitting: Internal Medicine

## 2013-04-24 ENCOUNTER — Ambulatory Visit (AMBULATORY_SURGERY_CENTER): Payer: BC Managed Care – PPO | Admitting: Internal Medicine

## 2013-04-24 ENCOUNTER — Encounter: Payer: Self-pay | Admitting: Internal Medicine

## 2013-04-24 VITALS — BP 125/54 | HR 70 | Temp 97.9°F | Resp 20 | Ht 61.0 in | Wt 197.0 lb

## 2013-04-24 DIAGNOSIS — D126 Benign neoplasm of colon, unspecified: Secondary | ICD-10-CM

## 2013-04-24 DIAGNOSIS — Z1211 Encounter for screening for malignant neoplasm of colon: Secondary | ICD-10-CM

## 2013-04-24 MED ORDER — SODIUM CHLORIDE 0.9 % IV SOLN: 500.0000 mL | INTRAVENOUS | Status: DC

## 2013-04-24 NOTE — Progress Notes (Signed)
Patient did not experience any of the following events: a burn prior to discharge; a fall within the facility; wrong site/side/patient/procedure/implant event; or a hospital transfer or hospital admission upon discharge from the facility. (G8907) Patient did not have preoperative order for IV antibiotic SSI prophylaxis. (G8918)  

## 2013-04-24 NOTE — Patient Instructions (Signed)
HOLD ASPIRIN PRODUCTS, ANTIINFLAMMATORIES (ALEVE,ADVIL,MOTRIN,NAPROSYN ETC) FOR ONE WEEK, AUGUST [id number].    YOU HAD AN ENDOSCOPIC PROCEDURE TODAY AT THE [facility]: Refer to the procedure report that was given to you for any specific questions about what was found during the examination.  If the procedure report does not answer your questions, please call your gastroenterologist to clarify.  If you requested that your care partner not be given the details of your procedure findings, then the procedure report has been included in a sealed envelope for you to review at your convenience later.  YOU SHOULD EXPECT: Some feelings of bloating in the abdomen. Passage of more gas than usual.  Walking can help get rid of the air that was put into your GI tract during the procedure and reduce the bloating. If you had a lower endoscopy (such as a colonoscopy or flexible sigmoidoscopy) you may notice spotting of blood in your stool or on the toilet paper. If you underwent a bowel prep for your procedure, then you may not have a normal bowel movement for a few days.  DIET: Your first meal following the procedure should be a light meal and then it is ok to progress to your normal diet.  A half-sandwich or bowl of soup is an example of a good first meal.  Heavy or fried foods are harder to digest and may make you feel nauseous or bloated.  Likewise meals heavy in dairy and vegetables can cause extra gas to form and this can also increase the bloating.  Drink plenty of fluids but you should avoid alcoholic beverages for 24 hours.  ACTIVITY: Your care partner should take you home directly after the procedure.  You should plan to take it easy, moving slowly for the rest of the day.  You can resume normal activity the day after the procedure however you should NOT DRIVE or use heavy machinery for 24 hours (because of the sedation medicines used during the test).    SYMPTOMS TO REPORT IMMEDIATELY: A  gastroenterologist can be reached at any hour.  During normal business hours, 8:30 AM to 5:00 PM Monday through Friday, call [phone].  After hours and on weekends, please call the GI answering service at [phone] who will take a message and have the physician on call contact you.   Following lower endoscopy (colonoscopy or flexible sigmoidoscopy):  Excessive amounts of blood in the stool  Significant tenderness or worsening of abdominal pains  Swelling of the abdomen that is new, acute  Fever of 100F or higher  FOLLOW UP: If any biopsies were taken you will be contacted by phone or by letter within the next 1-3 weeks.  Call your gastroenterologist if you have not heard about the biopsies in 3 weeks.  Our staff will call the home number listed on your records the next business day following your procedure to check on you and address any questions or concerns that you may have at that time regarding the information given to you following your procedure. This is a courtesy call and so if there is no answer at the home number and we have not heard from you through the emergency physician on call, we will assume that you have returned to your regular daily activities without incident.  SIGNATURES/CONFIDENTIALITY: You and/or your care partner have signed paperwork which will be entered into your electronic medical record.  These signatures attest to the fact that that the information above on your After Visit Summary  has been reviewed and is understood.  Full responsibility of the confidentiality of this discharge information lies with you and/or your care-partner. 

## 2013-04-24 NOTE — Op Note (Addendum)
[facility] [address] [place], [address]   COLONOSCOPY PROCEDURE REPORT  PATIENT: Kimberly Christian, Kimberly Christian  MR#: [id number] BIRTHDATE: 1955/09/27 , 57  yrs. old GENDER: Female ENDOSCOPIST: Beverley Fiedler, MD REFERRED XB:JYNWG Aron, M.D. PROCEDURE DATE:  04/24/2013 PROCEDURE:   Colonoscopy with snare polypectomy First Screening Colonoscopy - Avg.  risk and is 50 yrs.  old or older Yes.  Prior Negative Screening - Now for repeat screening. N/A  History of Adenoma - Now for follow-up colonoscopy & has been > or = to 3 yrs.  N/A  Polyps Removed Today? Yes. ASA CLASS:   Class II INDICATIONS:average risk screening and first colonoscopy. MEDICATIONS: MAC sedation, administered by CRNA and propofol (Diprivan) 350mg  IV  DESCRIPTION OF PROCEDURE:   After the risks benefits and alternatives of the procedure were thoroughly explained, informed consent was obtained.  A digital rectal exam revealed no rectal mass.   The LB PFC-H190 [id number]  endoscope was introduced through the anus and advanced to the terminal ileum which was intubated for a short distance. No adverse events experienced.   The quality of the prep was good, using MoviPrep  The instrument was then slowly withdrawn as the colon was fully examined.     COLON FINDINGS: The mucosa appeared normal in the terminal ileum. Six sessile polyps measuring 3-6 mm in size were found in the ascending colon and transverse colon.  Polypectomy was performed using cold snare.  All resections were complete and all polyp tissue was completely retrieved.   Two sessile polyps measuring 6 and 3 mm in size were found in the descending colon and rectosigmoid colon.  Polypectomy was performed using cold snare. All resections were complete and all polyp tissue was completely retrieved.  Retroflexed views revealed no abnormalities. The time to cecum=2 minutes 45 seconds.  Withdrawal time=17 minutes 16 seconds.  The scope was withdrawn and  the procedure completed.   COMPLICATIONS: There were no complications.  ENDOSCOPIC IMPRESSION: 1.   Normal mucosa in the terminal ileum 2.   Six sessile polyps measuring 3-6 mm in size were found in the ascending colon and transverse colon; Polypectomy was performed using cold snare 3.   Two sessile polyps measuring 6 and 3 mm in size were found in the descending colon and rectosigmoid colon; Polypectomy was performed using cold snare  RECOMMENDATIONS: 1.  Hold aspirin, aspirin products, and anti-inflammatory medication for 1 week. 2.  Await pathology results 3.  If the polyps removed today are proven to be adenomatous (pre-cancerous) polyps, you will need a colonoscopy in 3 years. Otherwise you should continue to follow colorectal cancer screening guidelines for "routine risk" patients with a colonoscopy in 10 years.  You will receive a letter within 1-2 weeks with the results of your biopsy as well as final recommendations.  Please call my office if you have not received a letter after 3 weeks.   eSigned:  Beverley Fiedler, MD 04/24/2013 9:37 AM Revised: 04/24/2013 9:37 AM  cc: The Patient and Ruthe Mannan MD   PATIENT NAME:  Kimberly Christian, Kimberly Christian MR#: [id number]

## 2013-04-24 NOTE — Progress Notes (Signed)
Called to room to assist during endoscopic procedure.  Patient ID and intended procedure confirmed with present staff. Received instructions for my participation in the procedure from the performing physician.  

## 2013-04-25 ENCOUNTER — Telehealth: Payer: Self-pay | Admitting: *Deleted

## 2013-04-25 NOTE — Telephone Encounter (Signed)
  Follow up Call-  Call back number 04/24/2013  Post procedure Call Back phone  # [phone]  Permission to leave phone message Yes     Patient questions:  Do you have a fever, pain , or abdominal swelling? no Pain Score  0 *  Have you tolerated food without any problems? yes  Have you been able to return to your normal activities? yes  Do you have any questions about your discharge instructions: Diet   no Medications  no Follow up visit  no  Do you have questions or concerns about your Care? no  Actions: * If pain score is 4 or above: No action needed, pain <4.

## 2013-04-29 ENCOUNTER — Encounter: Payer: Self-pay | Admitting: Internal Medicine

## 2013-04-30 ENCOUNTER — Encounter: Payer: Self-pay | Admitting: Family Medicine

## 2013-04-30 ENCOUNTER — Ambulatory Visit (INDEPENDENT_AMBULATORY_CARE_PROVIDER_SITE_OTHER): Payer: BC Managed Care – PPO | Admitting: Family Medicine

## 2013-04-30 VITALS — BP 120/80 | HR 93 | Temp 98.5°F | Wt 198.0 lb

## 2013-04-30 DIAGNOSIS — L03115 Cellulitis of right lower limb: Secondary | ICD-10-CM | POA: Insufficient documentation

## 2013-04-30 DIAGNOSIS — L989 Disorder of the skin and subcutaneous tissue, unspecified: Secondary | ICD-10-CM

## 2013-04-30 MED ORDER — DOXYCYCLINE HYCLATE 100 MG PO CAPS: 100.0000 mg | ORAL_CAPSULE | Freq: Two times a day (BID) | ORAL | Status: DC

## 2013-04-30 NOTE — Assessment & Plan Note (Signed)
Purulent cellulitis - treat with doxy 10 d course. RTC 2 d for f/u. RTC sooner if area comes to head or any worsening. See pt instructions for plan. Pt agrees with plan.

## 2013-04-30 NOTE — Patient Instructions (Signed)
I'm worried about cellulitis and possibly developing abscess or boil. Treat with doxycycline twice daily for 10 days. Return on Friday for recheck. If worsening pain or spreading redness, please seek care prior.  Cellulitis Cellulitis is an infection of the skin and the tissue beneath it. The infected area is usually red and tender. Cellulitis occurs most often in the arms and lower legs.  CAUSES  Cellulitis is caused by bacteria that enter the skin through cracks or cuts in the skin. The most common types of bacteria that cause cellulitis are Staphylococcus and Streptococcus. SYMPTOMS   Redness and warmth.  Swelling.  Tenderness or pain.  Fever. DIAGNOSIS  Your caregiver can usually determine what is wrong based on a physical exam. Blood tests may also be done. TREATMENT  Treatment usually involves taking an antibiotic medicine. HOME CARE INSTRUCTIONS   Take your antibiotics as directed. Finish them even if you start to feel better.  Keep the infected arm or leg elevated to reduce swelling.  Apply a warm cloth to the affected area up to 4 times per day to relieve pain.  Only take over-the-counter or prescription medicines for pain, discomfort, or fever as directed by your caregiver.  Keep all follow-up appointments as directed by your caregiver. SEEK MEDICAL CARE IF:   You notice red streaks coming from the infected area.  Your red area gets larger or turns dark in color.  Your bone or joint underneath the infected area becomes painful after the skin has healed.  Your infection returns in the same area or another area.  You notice a swollen bump in the infected area.  You develop new symptoms. SEEK IMMEDIATE MEDICAL CARE IF:   You have a fever.  You feel very sleepy.  You develop vomiting or diarrhea.  You have a general ill feeling (malaise) with muscle aches and pains. MAKE SURE YOU:   Understand these instructions.  Will watch your condition.  Will get  help right away if you are not doing well or get worse. Document Released: 06/21/2005 Document Revised: 03/12/2012 Document Reviewed: 11/27/2011 [facility] Patient Information 2014 [place], [place].

## 2013-04-30 NOTE — Progress Notes (Signed)
CC: red, swollen, painful area on leg  HPI: 3 wks ago tripped going up concrete steps;Had a small bruise on leg, not particularly painful.  A week and a half ago shaved legs and got pedicure.  Since pedicure, leg has turned red and has gotten worse; very painful and leg is swelling.Redness is now going down leg. Red area on leg was not bruised, red, etc until after pedicure (ie was not marked up after the fall, but this was the same area the leg hit the steps when she fell). Leg was not fully submerged during pedicure but they did exfoliate, etc this area (she has had pedicures here before with no problem and there was no pain during the pedicure). Pt does not remember cutting herself shaving in shower, etc.Swelling has just started in the last 3 days. Pt has been cleaning area with H2O2 and neospsorin but has not taken anyting for it; touching it is very painful. Describes pain as "shooting"; 9/10 on pain scale sometimes at worst. Has not run a fever, but leg has been hot, no N/V or Chills.  PMH: Pt takes celexa only; tubal ligation is only surgery NKDA  SH: Smokes 1 ppd; occ etoh  ROS: see HPI  PE: HENT: neck supple, no jvd, no adenopathy Cardio: rrr, normal radial pulses bilat Pulm: ctab, no inc wob, cr 2+ GI: bs +, nontender Msk: Rt leg 42 cm, lt leg 40 cm, pedal pulses intact bilat, ROM intact x4 Warm, erythematous area just below patella on right leg, with swelling.  A: possible cellulitis  P: keflex 500 mg po qid x 14 days Levofloxacin 750 mg po q day x 14 days Monitor for fever or spread of reddening or swelling Update tetanus if necessary

## 2013-04-30 NOTE — Progress Notes (Signed)
Pt seen and examined with PA student Irving Burton.  Suffered fall 3 wks ago - hit R leg - bruise developed.  Did not break skin. 1.5 wks ago, shaved legs and got pedicure. Since then noticing worsening area of leg redness, pain, swelling.  So far has cleaned with peroxide and neosporin.  Denies fever, nausea, vomiting, chills.  1 ppd smoker.  PE: GEN: WDWN CF, NAD MSK: R upper leg below patella with 6cm patch of erythema, warmth and edema, tender to palpation.  Mildly fluctuant but nothing to drain.  Area of erythema delineated. No edema noted of lower leg.

## 2013-05-02 ENCOUNTER — Encounter: Payer: Self-pay | Admitting: Family Medicine

## 2013-05-02 ENCOUNTER — Ambulatory Visit (INDEPENDENT_AMBULATORY_CARE_PROVIDER_SITE_OTHER): Payer: BC Managed Care – PPO | Admitting: Family Medicine

## 2013-05-02 VITALS — BP 138/78 | HR 80 | Temp 98.2°F

## 2013-05-02 DIAGNOSIS — L03115 Cellulitis of right lower limb: Secondary | ICD-10-CM

## 2013-05-02 DIAGNOSIS — L02419 Cutaneous abscess of limb, unspecified: Secondary | ICD-10-CM

## 2013-05-02 MED ORDER — CEPHALEXIN 500 MG PO CAPS: 500.0000 mg | ORAL_CAPSULE | Freq: Three times a day (TID) | ORAL | Status: DC

## 2013-05-02 NOTE — Progress Notes (Signed)
  Subjective:    Patient ID: Kimberly Christian, female    DOB: 11/03/1955, 57 y.o.   MRN: [id number]  HPI CC: f/u cellulitis   see prior note for details. Seen here 2 d ago with concern for purulent cellulitis vs developing abscess.  Started on doxy. Tolerating med well.  No spreading redness, nausea, fevers.  Past Medical History  Diagnosis Date  . Allergy   . Depression   . Hyperlipidemia      Review of Systems Per HPI    Objective:   Physical Exam  Nursing note and vitals reviewed. Constitutional: She appears well-developed and well-nourished. No distress.  Skin: There is erythema.  R upper leg below patella with 6cm patch of erythema, warmth and edema, tender to palpation.  No fluctuance, some induration, but nothing to drain.  Area of erythema has not spread past area delineated last visit. No edema noted of lower leg. Tenderness improved. Erythema slightly receding       Assessment & Plan:

## 2013-05-02 NOTE — Assessment & Plan Note (Signed)
Mild improvement .  Still nothing to drain. Add on keflex 500mg  tid x 10 days, continue doxy. rtc 5d for recheck. Discussed red flags to seek urgent care over weekend.

## 2013-05-02 NOTE — Patient Instructions (Signed)
It is looking better - no spreading of redness. Continue doxycycline for full 10 days. Add on keflex 500mg  three times daily for 10 days. Keep elevating leg, warm compresses. To urgent care if redness spreading or comes to head over weekend. Otherwise return to see [place] in 5 days for recheck, sooner if needed.

## 2013-05-07 ENCOUNTER — Ambulatory Visit (INDEPENDENT_AMBULATORY_CARE_PROVIDER_SITE_OTHER): Payer: BC Managed Care – PPO | Admitting: Family Medicine

## 2013-05-07 ENCOUNTER — Encounter: Payer: Self-pay | Admitting: Family Medicine

## 2013-05-07 VITALS — BP 124/80 | HR 78 | Temp 98.0°F | Wt 200.2 lb

## 2013-05-07 DIAGNOSIS — L03115 Cellulitis of right lower limb: Secondary | ICD-10-CM

## 2013-05-07 DIAGNOSIS — L02419 Cutaneous abscess of limb, unspecified: Secondary | ICD-10-CM

## 2013-05-07 NOTE — Assessment & Plan Note (Signed)
Mild improvement will continue course. Pt agrees. Cellulitis has not come to a head. rtc Monday for recheck.

## 2013-05-07 NOTE — Patient Instructions (Signed)
Let's continue the course.   Add on ibuprofen 400-600mg  twice daily with food tomorrow for tenderness. Return on Monday for a recheck.

## 2013-05-07 NOTE — Progress Notes (Signed)
  Subjective:    Patient ID: Kimberly Christian, female    DOB: 1955-11-17, 57 y.o.   MRN: [id number]  HPI CC: recheck leg  Compliant with doxy and keflex.  Overall not much change. No fevers/chills.  Continues with redness but receding.  Remains tender to touch.  Recent colonoscopy so has been avoiding NSAIDs.  Review of Systems Per HPI    Objective:   Physical Exam  Nursing note and vitals reviewed. Constitutional: She appears well-developed and well-nourished. No distress.  Skin: There is erythema.  R upper leg below patella with smaller patch of erythema, warmth and edema, tender to palpation.  No induration. Area of erythema receding. No edema noted of lower leg. Tenderness improved.       Assessment & Plan:

## 2013-05-12 ENCOUNTER — Ambulatory Visit (INDEPENDENT_AMBULATORY_CARE_PROVIDER_SITE_OTHER): Payer: BC Managed Care – PPO | Admitting: Family Medicine

## 2013-05-12 ENCOUNTER — Encounter: Payer: Self-pay | Admitting: Family Medicine

## 2013-05-12 VITALS — BP 126/78 | HR 64 | Temp 98.2°F | Wt 198.8 lb

## 2013-05-12 DIAGNOSIS — L03115 Cellulitis of right lower limb: Secondary | ICD-10-CM

## 2013-05-12 DIAGNOSIS — L02419 Cutaneous abscess of limb, unspecified: Secondary | ICD-10-CM

## 2013-05-12 NOTE — Assessment & Plan Note (Signed)
Marked improvement. No further erythema. Finishing keflex course. Red flags to return discussed.

## 2013-05-12 NOTE — Patient Instructions (Signed)
I'm glad leg is better! Let me know if redness returning or pain returning. Otherwise finish antibiotic and should get better with time.

## 2013-05-12 NOTE — Progress Notes (Addendum)
  Subjective:    Patient ID: Radford Pax, female    DOB: 1955-11-08, 57 y.o.   MRN: [id number]  HPI CC: recheck leg  Compliant with keflex.  Completed doxy course.  Significant improvement noted in the last week.  No fevers/chills.  No erythema.  Markedly decreased tenderness to palpation.  Review of Systems  Per HPI    Objective:   Physical Exam  Nursing note and vitals reviewed. Constitutional: She appears well-developed and well-nourished. No distress.  Skin: No erythema.  Post inflammatory hyperpigmentation of prior patch of cellulitis.  Nontender to palpation.  Induration present but no fluctuance..       Assessment & Plan:

## 2013-05-22 ENCOUNTER — Other Ambulatory Visit: Payer: Self-pay

## 2013-05-22 MED ORDER — CITALOPRAM HYDROBROMIDE 40 MG PO TABS: 40.0000 mg | ORAL_TABLET | Freq: Every day | ORAL | Status: DC

## 2013-05-22 NOTE — Telephone Encounter (Signed)
Pt request refill citalopram to [facility]; advised done.

## 2013-06-12 ENCOUNTER — Other Ambulatory Visit: Payer: BC Managed Care – PPO

## 2013-07-31 ENCOUNTER — Other Ambulatory Visit: Payer: Self-pay

## 2013-09-16 ENCOUNTER — Other Ambulatory Visit: Payer: Self-pay | Admitting: Family Medicine

## 2013-09-17 NOTE — Telephone Encounter (Signed)
Pt requesting medication refill. Last ov 04/2013 with no future appts scheduled. Medication not current only in med hx. pls advise

## 2013-09-17 NOTE — Telephone Encounter (Signed)
Please refill times one in PCP absence 

## 2013-09-19 NOTE — Telephone Encounter (Signed)
done

## 2013-10-28 ENCOUNTER — Other Ambulatory Visit: Payer: Self-pay | Admitting: Family Medicine

## 2013-12-21 ENCOUNTER — Other Ambulatory Visit: Payer: Self-pay | Admitting: Family Medicine

## 2013-12-31 ENCOUNTER — Other Ambulatory Visit: Payer: Self-pay | Admitting: Family Medicine

## 2013-12-31 ENCOUNTER — Other Ambulatory Visit: Payer: Self-pay | Admitting: Internal Medicine

## 2014-01-01 ENCOUNTER — Other Ambulatory Visit: Payer: Self-pay

## 2014-01-01 MED ORDER — CITALOPRAM HYDROBROMIDE 40 MG PO TABS: ORAL_TABLET | ORAL | Status: DC

## 2014-01-01 NOTE — Telephone Encounter (Signed)
I will refill one time only.  Needs to be seen for further refills.

## 2014-01-01 NOTE — Telephone Encounter (Signed)
Pt left v/m requesting refill citalopram to [facility]; pt last seen cpx on 02/13/13.Please advise. Pt is going out of town this afternoon and request cb when refilled.

## 2014-01-28 ENCOUNTER — Other Ambulatory Visit: Payer: Self-pay | Admitting: Family Medicine

## 2014-01-29 NOTE — Telephone Encounter (Signed)
Needs an appt

## 2014-02-18 ENCOUNTER — Other Ambulatory Visit: Payer: Self-pay

## 2014-02-18 DIAGNOSIS — Z1231 Encounter for screening mammogram for malignant neoplasm of breast: Secondary | ICD-10-CM

## 2014-02-25 ENCOUNTER — Ambulatory Visit
Admission: RE | Admit: 2014-02-25 | Discharge: 2014-02-25 | Disposition: A | Discharge status: Home / Self Care | Payer: BC Managed Care – PPO | Source: Ambulatory Visit

## 2014-02-25 DIAGNOSIS — Z1231 Encounter for screening mammogram for malignant neoplasm of breast: Secondary | ICD-10-CM

## 2014-03-10 ENCOUNTER — Other Ambulatory Visit: Payer: Self-pay | Admitting: Family Medicine

## 2014-04-07 ENCOUNTER — Other Ambulatory Visit: Payer: Self-pay | Admitting: Family Medicine

## 2014-04-07 DIAGNOSIS — E785 Hyperlipidemia, unspecified: Secondary | ICD-10-CM

## 2014-04-07 DIAGNOSIS — Z0000 Encounter for general adult medical examination without abnormal findings: Secondary | ICD-10-CM

## 2014-04-09 ENCOUNTER — Other Ambulatory Visit (INDEPENDENT_AMBULATORY_CARE_PROVIDER_SITE_OTHER): Payer: BC Managed Care – PPO

## 2014-04-09 DIAGNOSIS — E785 Hyperlipidemia, unspecified: Secondary | ICD-10-CM

## 2014-04-09 DIAGNOSIS — Z0000 Encounter for general adult medical examination without abnormal findings: Secondary | ICD-10-CM

## 2014-04-09 LAB — CBC WITH DIFFERENTIAL/PLATELET
Basophils Absolute: 0.1 10*3/uL (ref 0.0–0.1)
Basophils Relative: 0.8 % (ref 0.0–3.0)
Eosinophils Absolute: 0.2 10*3/uL (ref 0.0–0.7)
Eosinophils Relative: 1.8 % (ref 0.0–5.0)
HCT: 46.6 % — ABNORMAL HIGH (ref 36.0–46.0)
Hemoglobin: 15.4 g/dL — ABNORMAL HIGH (ref 12.0–15.0)
Lymphocytes Relative: 25.4 % (ref 12.0–46.0)
Lymphs Abs: 2.6 10*3/uL (ref 0.7–4.0)
MCHC: 33 g/dL (ref 30.0–36.0)
MCV: 100.4 fl — ABNORMAL HIGH (ref 78.0–100.0)
Monocytes Absolute: 0.6 10*3/uL (ref 0.1–1.0)
Monocytes Relative: 5.7 % (ref 3.0–12.0)
Neutro Abs: 6.8 10*3/uL (ref 1.4–7.7)
Neutrophils Relative %: 66.3 % (ref 43.0–77.0)
Platelets: 232 10*3/uL (ref 150.0–400.0)
RBC: 4.64 Mil/uL (ref 3.87–5.11)
RDW: 13.7 % (ref 11.5–15.5)
WBC: 10.2 10*3/uL (ref 4.0–10.5)

## 2014-04-09 LAB — COMPREHENSIVE METABOLIC PANEL
ALT: 17 U/L (ref 0–35)
AST: 18 U/L (ref 0–37)
Albumin: 3.5 g/dL (ref 3.5–5.2)
Alkaline Phosphatase: 62 U/L (ref 39–117)
BUN: 11 mg/dL (ref 6–23)
CO2: 31 mEq/L (ref 19–32)
Calcium: 9 mg/dL (ref 8.4–10.5)
Chloride: 109 mEq/L (ref 96–112)
Creatinine, Ser: 0.8 mg/dL (ref 0.4–1.2)
GFR: 76.54 mL/min (ref >60.00)
Glucose, Bld: 108 mg/dL — ABNORMAL HIGH (ref 70–99)
Potassium: 4.5 mEq/L (ref 3.5–5.1)
Sodium: 143 mEq/L (ref 135–145)
Total Bilirubin: 0.6 mg/dL (ref 0.2–1.2)
Total Protein: 6.6 g/dL (ref 6.0–8.3)

## 2014-04-09 LAB — LIPID PANEL
Cholesterol: 189 mg/dL (ref 0–200)
HDL: 46.2 mg/dL (ref >39.00)
LDL Cholesterol: 126 mg/dL — ABNORMAL HIGH (ref 0–99)
NonHDL: 142.8
Total CHOL/HDL Ratio: 4
Triglycerides: 83 mg/dL (ref 0.0–149.0)
VLDL: 16.6 mg/dL (ref 0.0–40.0)

## 2014-04-09 LAB — TSH: TSH: 1.7 u[IU]/mL (ref 0.35–4.50)

## 2014-04-13 ENCOUNTER — Other Ambulatory Visit: Payer: Self-pay | Admitting: Family Medicine

## 2014-04-16 ENCOUNTER — Other Ambulatory Visit (HOSPITAL_COMMUNITY)
Admission: RE | Admit: 2014-04-16 | Discharge: 2014-04-16 | Disposition: A | Discharge status: Home / Self Care | Payer: BC Managed Care – PPO | Source: Ambulatory Visit | Attending: Family Medicine | Admitting: Family Medicine

## 2014-04-16 ENCOUNTER — Encounter: Payer: Self-pay | Admitting: Family Medicine

## 2014-04-16 ENCOUNTER — Ambulatory Visit (INDEPENDENT_AMBULATORY_CARE_PROVIDER_SITE_OTHER): Payer: BC Managed Care – PPO | Admitting: Family Medicine

## 2014-04-16 VITALS — BP 126/70 | HR 83 | Temp 98.1°F | Ht 60.75 in | Wt 200.0 lb

## 2014-04-16 DIAGNOSIS — F17200 Nicotine dependence, unspecified, uncomplicated: Secondary | ICD-10-CM

## 2014-04-16 DIAGNOSIS — E669 Obesity, unspecified: Secondary | ICD-10-CM | POA: Insufficient documentation

## 2014-04-16 DIAGNOSIS — Z1151 Encounter for screening for human papillomavirus (HPV): Secondary | ICD-10-CM | POA: Insufficient documentation

## 2014-04-16 DIAGNOSIS — R739 Hyperglycemia, unspecified: Secondary | ICD-10-CM | POA: Insufficient documentation

## 2014-04-16 DIAGNOSIS — F329 Major depressive disorder, single episode, unspecified: Secondary | ICD-10-CM

## 2014-04-16 DIAGNOSIS — F3289 Other specified depressive episodes: Secondary | ICD-10-CM

## 2014-04-16 DIAGNOSIS — Z01419 Encounter for gynecological examination (general) (routine) without abnormal findings: Secondary | ICD-10-CM

## 2014-04-16 DIAGNOSIS — E785 Hyperlipidemia, unspecified: Secondary | ICD-10-CM

## 2014-04-16 DIAGNOSIS — R7309 Other abnormal glucose: Secondary | ICD-10-CM

## 2014-04-16 DIAGNOSIS — Z0000 Encounter for general adult medical examination without abnormal findings: Secondary | ICD-10-CM

## 2014-04-16 LAB — HEMOGLOBIN A1C: Hgb A1c MFr Bld: 5.8 % (ref 4.6–6.5)

## 2014-04-16 MED ORDER — SIMVASTATIN 10 MG PO TABS: ORAL_TABLET | ORAL | Status: DC

## 2014-04-16 NOTE — Addendum Note (Signed)
Addended by: Modena Nunnery on: 04/16/2014 12:18 PM   Modules accepted: Orders

## 2014-04-16 NOTE — Assessment & Plan Note (Signed)
Discussed USPSTF recommendations of cervical cancer screening.  She is aware that interval of 3 years is recommended but pt would prefer to have pap smear done today.  Pap smear done.

## 2014-04-16 NOTE — Progress Notes (Signed)
Pre visit review using our clinic review tool, if applicable. No additional management support is needed unless otherwise documented below in the visit note. 

## 2014-04-16 NOTE — Assessment & Plan Note (Signed)
Not ready to quit.  

## 2014-04-16 NOTE — Assessment & Plan Note (Signed)
Check a1c today. We discussed possibility of starting Metformin. Will await results of a1c today.

## 2014-04-16 NOTE — Progress Notes (Signed)
58 yo pleasant female here for CPX.  Last pap smear normal -12/27/2011 (done by me). No h/o abnormal pap smears in the past 5 years.  LMP 16 years ago.  No post menopausal bleeding.  Had mammogram done on 02/26/14.  Colonoscopy 04/24/13 ([facility])- 3 year recall.  Depression- has been stable on Celexa 40 mg daily since December 28, 1996 when her son died.  Denies any signs or symptoms of anxiety or depression even with recent stressor.   Hyperglycemia- admits to not watching what she eats. No family h/o DM.  Tobacco abuse- she had quit using Chantix but restarted when her husband was diagnosed with cancer.  He is doing very well. Not quite ready to quit again.  HLD- improved on low dose zocor. Lab Results  Component Value Date   CHOL 189 04/09/2014   HDL 46.20 04/09/2014   LDLCALC 126* 04/09/2014   LDLDIRECT 143.8 04/10/2013   TRIG 83.0 04/09/2014   CHOLHDL 4 04/09/2014   Lab Results  Component Value Date   NA 143 04/09/2014   K 4.5 04/09/2014   CL 109 04/09/2014   CO2 31 04/09/2014   Lab Results  Component Value Date   CREATININE 0.8 04/09/2014     Patient Active Problem List   Diagnosis Date Noted  . HLD (hyperlipidemia) 02/13/2013  . Routine general medical examination at a health care facility 12/27/2011  . Routine gynecological examination 12/27/2011  . TOBACCO ABUSE 11/22/2010  . DEPRESSION 11/22/2010  . ALLERGIC RHINITIS 11/22/2010   Past Medical History  Diagnosis Date  . Allergy   . Depression   . Hyperlipidemia    Past Surgical History  Procedure Laterality Date  . Tubal ligation    . Wisdom tooth extraction     History  Substance Use Topics  . Smoking status: Current Every Day Smoker -- 1.00 packs/day for 20 years    Types: Cigarettes  . Smokeless tobacco: Never Used  . Alcohol Use: 4.8 - 6.0 oz/week    8-10 Cans of beer per week     Comment: Occasional   Family History  Problem Relation Age of Onset  . Cancer Mother     lung  . Pulmonary fibrosis Father    No  Known Allergies Current Outpatient Prescriptions on File Prior to Visit  Medication Sig Dispense Refill  . citalopram (CELEXA) 40 MG tablet TAKE 1 TABLET (40 MG TOTAL) BY MOUTH DAILY.  90 tablet  0   No current facility-administered medications on file prior to visit.   The PMH, PSH, Social History, Family History, Medications, and allergies have been reviewed in [facility], and have been updated if relevant.  ROS: See HPI Patient reports no  vision/ hearing changes,anorexia, weight change, fever ,adenopathy, persistant / recurrent hoarseness, swallowing issues, chest pain, edema,persistant / recurrent cough, hemoptysis, dyspnea(rest, exertional, paroxysmal nocturnal), gastrointestinal  bleeding (melena, rectal bleeding), abdominal pain, excessive heart burn, GU symptoms(dysuria, hematuria, pyuria, voiding/incontinence  Issues) syncope, focal weakness, severe memory loss, concerning skin lesions, depression, anxiety, abnormal bruising/bleeding, major joint swelling, breast masses or abnormal vaginal bleeding.    Wt Readings from Last 3 Encounters:  04/16/14 200 lb (90.719 kg)  05/12/13 198 lb 12 oz (90.152 kg)  05/07/13 200 lb 4 oz (90.833 kg)    Physical exam: BP 126/70  Pulse 83  Temp(Src) 98.1 F (36.7 C) (Oral)  Ht 5' 0.75" (1.543 m)  Wt 200 lb (90.719 kg)  BMI 38.10 kg/m2  SpO2 96%   General:  Well-developed,well-nourished,in no acute distress; alert,appropriate  and cooperative throughout examination Head:  normocephalic and atraumatic.   Eyes:  vision grossly intact, pupils equal, pupils round, and pupils reactive to light.   Ears:  R ear normal and L ear normal.   Nose:  no external deformity.   Mouth:  good dentition.   Neck:  No deformities, masses, or tenderness noted. Breasts:  No mass, nodules, thickening, tenderness, bulging, retraction, inflamation, nipple discharge or skin changes noted.   Lungs:  Normal respiratory effort, chest expands symmetrically. Lungs are clear to  auscultation, no crackles or wheezes. Heart:  Normal rate and regular rhythm. S1 and S2 normal without gallop, murmur, click, rub or other extra sounds. Abdomen:  Bowel sounds positive,abdomen soft and non-tender without masses, organomegaly or hernias noted. Rectal:  no external abnormalities.   Genitalia:  Pelvic Exam:        External: normal female genitalia without lesions or masses        Vagina: normal without lesions or masses        Cervix: normal without lesions or masses        Adnexa: normal bimanual exam without masses or fullness        Uterus: normal by palpation        Pap smear: performed Msk:  No deformity or scoliosis noted of thoracic or lumbar spine.   Extremities:  No clubbing, cyanosis, edema, or deformity noted with normal full range of motion of all joints.   Neurologic:  alert & oriented X3 and gait normal.   Skin:  Intact without suspicious lesions or rashes Cervical Nodes:  No lymphadenopathy noted Axillary Nodes:  No palpable lymphadenopathy Psych:  Cognition and judgment appear intact. Alert and cooperative with normal attention span and concentration. No apparent delusions, illusions, hallucinations

## 2014-04-16 NOTE — Assessment & Plan Note (Signed)
Improved on zocor. Continue current rx.  No changes.

## 2014-04-16 NOTE — Assessment & Plan Note (Signed)
Refused referral to nutritionist.

## 2014-04-16 NOTE — Assessment & Plan Note (Signed)
Reviewed preventive care protocols, scheduled due services, and updated immunizations Discussed nutrition, exercise, diet, and healthy lifestyle.  

## 2014-04-16 NOTE — Patient Instructions (Signed)
Great to see you. We will call you with your lab results from today. 

## 2014-04-17 ENCOUNTER — Encounter: Payer: Self-pay | Admitting: Family Medicine

## 2014-04-17 LAB — CYTOLOGY - PAP

## 2014-04-21 ENCOUNTER — Encounter: Payer: Self-pay | Admitting: *Deleted

## 2014-05-14 ENCOUNTER — Other Ambulatory Visit: Payer: Self-pay | Admitting: Family Medicine

## 2014-09-01 ENCOUNTER — Other Ambulatory Visit: Payer: Self-pay | Admitting: *Deleted

## 2014-09-01 MED ORDER — CITALOPRAM HYDROBROMIDE 40 MG PO TABS: ORAL_TABLET | ORAL | Status: DC

## 2014-09-24 ENCOUNTER — Other Ambulatory Visit: Payer: Self-pay | Admitting: *Deleted

## 2014-09-24 MED ORDER — CITALOPRAM HYDROBROMIDE 40 MG PO TABS: ORAL_TABLET | ORAL | Status: DC

## 2014-10-28 ENCOUNTER — Other Ambulatory Visit: Payer: Self-pay

## 2014-10-28 MED ORDER — CITALOPRAM HYDROBROMIDE 40 MG PO TABS: ORAL_TABLET | ORAL | Status: DC

## 2014-10-28 NOTE — Telephone Encounter (Signed)
Pt left v/m pt changed ins co and now meds are to be sent to [facility]. Pt request refill citalopram to [facility]; spoke with anna at [facility] and cancelled refills remaining on citalopram. Pt notified done.

## 2015-01-27 ENCOUNTER — Encounter: Payer: Self-pay | Admitting: Family Medicine

## 2015-01-27 ENCOUNTER — Ambulatory Visit (INDEPENDENT_AMBULATORY_CARE_PROVIDER_SITE_OTHER): Payer: BLUE CROSS/BLUE SHIELD | Admitting: Family Medicine

## 2015-01-27 VITALS — BP 114/78 | HR 74 | Temp 98.3°F | Wt 193.5 lb

## 2015-01-27 DIAGNOSIS — N63 Unspecified lump in breast: Secondary | ICD-10-CM

## 2015-01-27 DIAGNOSIS — N6310 Unspecified lump in the right breast, unspecified quadrant: Secondary | ICD-10-CM

## 2015-01-27 NOTE — Patient Instructions (Signed)
Good to see you. Please stop by to see Kimberly Christian on your way out. 

## 2015-01-27 NOTE — Assessment & Plan Note (Signed)
?   Breast Abscess. Refer to general surgery and order diagnostic mammogram and ultrasound. The patient indicates understanding of these issues and agrees with the plan.

## 2015-01-27 NOTE — Progress Notes (Signed)
Pre visit review using our clinic review tool, if applicable. No additional management support is needed unless otherwise documented below in the visit note. 

## 2015-01-27 NOTE — Progress Notes (Signed)
Subjective:   Patient ID: Kimberly Christian, female    DOB: 22-May-1956, 59 y.o.   MRN: [id number]  Kimberly Christian is a pleasant 59 y.o. year old female who presents to clinic today with Cyst  on 01/27/2015  HPI:  Noticed painful mass in her right breast a week ago.  Becoming larger and more painful. Not draining anything.  No fevers or chills. No drainage from her nipples.  No other masses found by patient on self exam.  Maternal aunt had breast CA in her 66s.  Negative mammogram 02/26/14  Current Outpatient Prescriptions on File Prior to Visit  Medication Sig Dispense Refill  . citalopram (CELEXA) 40 MG tablet TAKE 1 TABLET (40 MG TOTAL) BY MOUTH DAILY. 90 tablet 0  . simvastatin (ZOCOR) 10 MG tablet TAKE 1 TABLET (10 MG TOTAL) BY MOUTH AT BEDTIME. 90 tablet 0   No current facility-administered medications on file prior to visit.    No Known Allergies  Past Medical History  Diagnosis Date  . Allergy   . Depression   . Hyperlipidemia     Past Surgical History  Procedure Laterality Date  . Tubal ligation    . Wisdom tooth extraction      Family History  Problem Relation Age of Onset  . Cancer Mother     lung  . Pulmonary fibrosis Father     History   Social History  . Marital Status: Single    Spouse Name: N/A  . Number of Children: N/A  . Years of Education: N/A   Occupational History  . Not on file.   Social History Main Topics  . Smoking status: Current Every Day Smoker -- 1.00 packs/day for 20 years    Types: Cigarettes  . Smokeless tobacco: Never Used  . Alcohol Use: 4.8 - 6.0 oz/week    8-10 Cans of beer per week     Comment: Occasional  . Drug Use: No  . Sexual Activity: Not on file   Other Topics Concern  . Not on file   Social History Narrative   The PMH, PSH, Social History, Family History, Medications, and allergies have been reviewed in [facility], and have been updated if relevant.   Review of Systems  Constitutional: Negative.   Respiratory:  Negative.   Cardiovascular: Negative.   Endocrine: Negative.   Neurological: Negative.   Hematological: Negative.   All other systems reviewed and are negative.      Objective:    BP 114/78 mmHg  Pulse 74  Temp(Src) 98.3 F (36.8 C) (Oral)  Wt 193 lb 8 oz (87.771 kg)  SpO2 96%   Physical Exam  Constitutional: She is oriented to person, place, and time. She appears well-developed and well-nourished. No distress.  HENT:  Head: Normocephalic and atraumatic.  Eyes: Conjunctivae are normal.  Cardiovascular: Normal rate.   Pulmonary/Chest:    Musculoskeletal: Normal range of motion.  Lymphadenopathy:    She has no axillary adenopathy.       Right axillary: No pectoral and no lateral adenopathy present.       Left axillary: No pectoral and no lateral adenopathy present. Neurological: She is alert and oriented to person, place, and time. A cranial nerve deficit is present.  Skin: Skin is warm and dry.  Psychiatric: She has a normal mood and affect. Her behavior is normal. Judgment and thought content normal.  Nursing note and vitals reviewed.         Assessment & Plan:   No diagnosis  found. No Follow-up on file.

## 2015-01-27 NOTE — Addendum Note (Signed)
Addended by: Lucille Passy on: 01/27/2015 11:54 AM   Modules accepted: Orders

## 2015-02-01 ENCOUNTER — Ambulatory Visit
Admission: RE | Admit: 2015-02-01 | Discharge: 2015-02-01 | Disposition: A | Discharge status: Home / Self Care | Payer: BLUE CROSS/BLUE SHIELD | Source: Ambulatory Visit | Attending: Family Medicine | Admitting: Family Medicine

## 2015-02-01 ENCOUNTER — Other Ambulatory Visit: Payer: Self-pay | Admitting: Family Medicine

## 2015-02-01 DIAGNOSIS — N6310 Unspecified lump in the right breast, unspecified quadrant: Secondary | ICD-10-CM

## 2015-02-08 ENCOUNTER — Other Ambulatory Visit: Payer: Self-pay | Admitting: Family Medicine

## 2015-02-08 ENCOUNTER — Ambulatory Visit: Payer: Self-pay | Admitting: Surgery

## 2015-02-15 ENCOUNTER — Ambulatory Visit
Admission: RE | Admit: 2015-02-15 | Discharge: 2015-02-15 | Disposition: A | Discharge status: Home / Self Care | Payer: BLUE CROSS/BLUE SHIELD | Source: Ambulatory Visit | Attending: Family Medicine | Admitting: Family Medicine

## 2015-02-15 DIAGNOSIS — N6310 Unspecified lump in the right breast, unspecified quadrant: Secondary | ICD-10-CM

## 2015-02-15 NOTE — Telephone Encounter (Signed)
Pt called to ck on status of citalopram to primemail; advised approved on 05/17/ and pt will ck with pharmacy.

## 2015-05-11 ENCOUNTER — Other Ambulatory Visit: Payer: Self-pay | Admitting: Family Medicine

## 2015-05-20 ENCOUNTER — Telehealth: Payer: Self-pay | Admitting: *Deleted

## 2015-05-20 MED ORDER — CITALOPRAM HYDROBROMIDE 40 MG PO TABS: ORAL_TABLET | ORAL | Status: DC

## 2015-05-20 NOTE — Telephone Encounter (Signed)
Per Barnett Applebaum, pt left vm at triage requesting refill of celexa. Spoke to pt and informed her that per policy she is required to have f/u appt for further refills. OV scheduled and Rx filled to last until appt

## 2015-05-26 ENCOUNTER — Encounter: Payer: Self-pay | Admitting: Family Medicine

## 2015-05-26 ENCOUNTER — Ambulatory Visit (INDEPENDENT_AMBULATORY_CARE_PROVIDER_SITE_OTHER): Payer: BLUE CROSS/BLUE SHIELD | Admitting: Family Medicine

## 2015-05-26 VITALS — BP 124/82 | HR 94 | Temp 98.2°F | Wt 195.8 lb

## 2015-05-26 DIAGNOSIS — F329 Major depressive disorder, single episode, unspecified: Secondary | ICD-10-CM | POA: Diagnosis not present

## 2015-05-26 DIAGNOSIS — Z23 Encounter for immunization: Secondary | ICD-10-CM | POA: Diagnosis not present

## 2015-05-26 DIAGNOSIS — F17200 Nicotine dependence, unspecified, uncomplicated: Secondary | ICD-10-CM

## 2015-05-26 DIAGNOSIS — E669 Obesity, unspecified: Secondary | ICD-10-CM

## 2015-05-26 DIAGNOSIS — E785 Hyperlipidemia, unspecified: Secondary | ICD-10-CM

## 2015-05-26 DIAGNOSIS — Z720 Tobacco use: Secondary | ICD-10-CM | POA: Diagnosis not present

## 2015-05-26 DIAGNOSIS — F32A Depression, unspecified: Secondary | ICD-10-CM

## 2015-05-26 DIAGNOSIS — R739 Hyperglycemia, unspecified: Secondary | ICD-10-CM | POA: Diagnosis not present

## 2015-05-26 MED ORDER — CITALOPRAM HYDROBROMIDE 40 MG PO TABS: ORAL_TABLET | ORAL | Status: DC

## 2015-05-26 NOTE — Progress Notes (Signed)
Pre visit review using our clinic review tool, if applicable. No additional management support is needed unless otherwise documented below in the visit note. 

## 2015-05-26 NOTE — Progress Notes (Signed)
Subjective:   Patient ID: Kimberly Christian, female    DOB: [birth year], 59 y.o.   MRN: [id number]  Kimberly Christian is a pleasant 59 y.o. year old female who presents to clinic today with Follow-up  on 05/26/2015  HPI:  Depression- has been stable on Celexa 40 mg daily since Jan 15, 1997 when her son died. Denies any signs or symptoms of anxiety or depression even with recent stressor.    Tobacco abuse- she had quit using Chantix but restarted when her husband was diagnosed with cancer. He is doing very well. Not quite ready to quit again.  HLD- improved on low dose zocor.  Lab Results  Component Value Date   CHOL 189 04/09/2014   HDL 46.20 04/09/2014   LDLCALC 126* 04/09/2014   LDLDIRECT 143.8 04/10/2013   TRIG 83.0 04/09/2014   CHOLHDL 4 04/09/2014   Current Outpatient Prescriptions on File Prior to Visit  Medication Sig Dispense Refill  . simvastatin (ZOCOR) 10 MG tablet TAKE 1 TABLET (10 MG TOTAL) BY MOUTH AT BEDTIME. 90 tablet 0   No current facility-administered medications on file prior to visit.    No Known Allergies  Past Medical History  Diagnosis Date  . Allergy   . Depression   . Hyperlipidemia     Past Surgical History  Procedure Laterality Date  . Tubal ligation    . Wisdom tooth extraction      Family History  Problem Relation Age of Onset  . Cancer Mother     lung  . Pulmonary fibrosis Father     Social History   Social History  . Marital Status: Single    Spouse Name: N/A  . Number of Children: N/A  . Years of Education: N/A   Occupational History  . Not on file.   Social History Main Topics  . Smoking status: Current Every Day Smoker -- 1.00 packs/day for 20 years    Types: Cigarettes  . Smokeless tobacco: Never Used  . Alcohol Use: 4.8 - 6.0 oz/week    8-10 Cans of beer per week     Comment: Occasional  . Drug Use: No  . Sexual Activity: Not on file   Other Topics Concern  . Not on file   Social History Narrative   The PMH,  PSH, Social History, Family History, Medications, and allergies have been reviewed in [facility], and have been updated if relevant.   Review of Systems  Constitutional: Negative.   HENT: Negative.   Cardiovascular: Negative.   Gastrointestinal: Negative.   Musculoskeletal: Negative.   Skin: Negative.   Hematological: Negative.   Psychiatric/Behavioral: Negative.   All other systems reviewed and are negative.      Objective:    BP 124/82 mmHg  Pulse 94  Temp(Src) 98.2 F (36.8 C) (Oral)  Wt 195 lb 12 oz (88.792 kg)  SpO2 96%   Physical Exam  Constitutional: She is oriented to person, place, and time. She appears well-developed and well-nourished. No distress.  HENT:  Head: Normocephalic.  Eyes: Conjunctivae are normal.  Cardiovascular: Normal rate and regular rhythm.   Pulmonary/Chest: Effort normal and breath sounds normal.  Musculoskeletal: She exhibits no edema.  Neurological: She is alert and oriented to person, place, and time. No cranial nerve deficit.  Skin: Skin is warm and dry.  Psychiatric: She has a normal mood and affect. Her behavior is normal. Judgment and thought content normal.  Nursing note and vitals reviewed.  Assessment & Plan:   Obesity  Hyperglycemia  HLD (hyperlipidemia)  Depression  Need for prophylactic vaccination and inoculation against cholera alone - Plan: Flu Vaccine QUAD 36+ mos PF IM (Fluarix & Fluzone Quad PF) No Follow-up on file.

## 2015-05-26 NOTE — Assessment & Plan Note (Signed)
Had labs done at work 2 months ago. She will fax results to me.

## 2015-05-26 NOTE — Assessment & Plan Note (Signed)
Stable on current dose of celexa. No changes made today.

## 2015-05-26 NOTE — Assessment & Plan Note (Signed)
Still not ready to quit 

## 2015-12-01 ENCOUNTER — Other Ambulatory Visit: Payer: Self-pay | Admitting: Family Medicine

## 2015-12-27 DIAGNOSIS — C801 Malignant (primary) neoplasm, unspecified: Secondary | ICD-10-CM | POA: Diagnosis not present

## 2015-12-27 DIAGNOSIS — C771 Secondary and unspecified malignant neoplasm of intrathoracic lymph nodes: Secondary | ICD-10-CM | POA: Diagnosis not present

## 2016-01-24 ENCOUNTER — Encounter: Payer: Self-pay | Admitting: Internal Medicine

## 2016-05-10 ENCOUNTER — Encounter: Payer: Self-pay | Admitting: Family Medicine

## 2016-05-10 ENCOUNTER — Ambulatory Visit (INDEPENDENT_AMBULATORY_CARE_PROVIDER_SITE_OTHER): Payer: BLUE CROSS/BLUE SHIELD | Admitting: Family Medicine

## 2016-05-10 VITALS — BP 122/70 | HR 85 | Temp 98.5°F | Ht 61.0 in | Wt 203.5 lb

## 2016-05-10 DIAGNOSIS — Z01419 Encounter for gynecological examination (general) (routine) without abnormal findings: Secondary | ICD-10-CM | POA: Insufficient documentation

## 2016-05-10 DIAGNOSIS — E785 Hyperlipidemia, unspecified: Secondary | ICD-10-CM | POA: Diagnosis not present

## 2016-05-10 DIAGNOSIS — E669 Obesity, unspecified: Secondary | ICD-10-CM | POA: Diagnosis not present

## 2016-05-10 DIAGNOSIS — F329 Major depressive disorder, single episode, unspecified: Secondary | ICD-10-CM | POA: Diagnosis not present

## 2016-05-10 DIAGNOSIS — Z1159 Encounter for screening for other viral diseases: Secondary | ICD-10-CM | POA: Diagnosis not present

## 2016-05-10 DIAGNOSIS — Z0000 Encounter for general adult medical examination without abnormal findings: Secondary | ICD-10-CM | POA: Diagnosis not present

## 2016-05-10 DIAGNOSIS — F32A Depression, unspecified: Secondary | ICD-10-CM

## 2016-05-10 MED ORDER — CITALOPRAM HYDROBROMIDE 40 MG PO TABS: ORAL_TABLET | ORAL | 1 refills | Status: DC

## 2016-05-10 NOTE — Assessment & Plan Note (Signed)
Reviewed preventive care protocols, scheduled due services, and updated immunizations Discussed nutrition, exercise, diet, and healthy lifestyle.  

## 2016-05-10 NOTE — Patient Instructions (Signed)
Good to see you. Please call to schedule your mammogram. Also call Dr. Hilarie Fredrickson to schedule your colonoscopy.  We will call you with your lab results.

## 2016-05-10 NOTE — Assessment & Plan Note (Signed)
Well controlled with current dose of celexa. No changes made today.

## 2016-05-10 NOTE — Assessment & Plan Note (Addendum)
Non compliant with statin. Recheck lipid panel and CMET today.

## 2016-05-10 NOTE — Progress Notes (Addendum)
Subjective:   Patient ID: Kimberly Christian, female    DOB: 1956/01/03, 60 y.o.   MRN: [id number]  Kimberly Christian is a pleasant 60 y.o. year old female who presents to clinic today with Annual Exam and follow up of chronic medical conditions on 05/10/2016  HPI:  Well woman- no h/o abnormal pap smears in the past 5 years. Last pap smear was done by me on 04/16/14. Mammogram 02/15/15 Colonoscopy 04/24/13- 3 year recall- Dr. Hilarie Fredrickson.  Depression- has been stable on Celexa 40 mg daily since 12-29-1996 when her son died. Denies any signs or symptoms of anxiety or depression even with recent stressor.   HLD- had improved with statin.  Overdue for labs.  Has not been taking her zocor due to myalgas.  Lab Results  Component Value Date   CHOL 189 04/09/2014   HDL 46.20 04/09/2014   LDLCALC 126 (H) 04/09/2014   LDLDIRECT 143.8 04/10/2013   TRIG 83.0 04/09/2014   CHOLHDL 4 04/09/2014   Current Outpatient Prescriptions on File Prior to Visit  Medication Sig Dispense Refill  . simvastatin (ZOCOR) 10 MG tablet TAKE 1 TABLET (10 MG TOTAL) BY MOUTH AT BEDTIME. (Patient not taking: Reported on 05/10/2016) 90 tablet 0   No current facility-administered medications on file prior to visit.     No Known Allergies  Past Medical History:  Diagnosis Date  . Allergy   . Depression   . Hyperlipidemia     Past Surgical History:  Procedure Laterality Date  . TUBAL LIGATION    . WISDOM TOOTH EXTRACTION      Family History  Problem Relation Age of Onset  . Cancer Mother     lung  . Pulmonary fibrosis Father     Social History   Social History  . Marital status: Single    Spouse name: N/A  . Number of children: N/A  . Years of education: N/A   Occupational History  . Not on file.   Social History Main Topics  . Smoking status: Current Every Day Smoker    Packs/day: 1.00    Years: 20.00    Types: Cigarettes  . Smokeless tobacco: Never Used  . Alcohol use 4.8 - 6.0 oz/week    8 - 10 Cans  of beer per week     Comment: Occasional  . Drug use: No  . Sexual activity: Not on file   Other Topics Concern  . Not on file   Social History Narrative  . No narrative on file   The PMH, PSH, Social History, Family History, Medications, and allergies have been reviewed in [facility], and have been updated if relevant.   Review of Systems  Constitutional: Negative.   HENT: Negative.   Cardiovascular: Negative.   Gastrointestinal: Negative.   Musculoskeletal: Negative.   Skin: Negative.   Hematological: Negative.   Psychiatric/Behavioral: Negative.   All other systems reviewed and are negative.      Objective:    BP 122/70   Pulse 85   Temp 98.5 F (36.9 C) (Oral)   Ht 5\' 1"  (1.549 m)   Wt 203 lb 8 oz (92.3 kg)   SpO2 95%   BMI 38.45 kg/m    Physical Exam  Constitutional: She is oriented to person, place, and time. She appears well-developed and well-nourished. No distress.  HENT:  Head: Normocephalic.  Eyes: Conjunctivae are normal.  Cardiovascular: Normal rate and regular rhythm.   Pulmonary/Chest: Effort normal and breath sounds normal.  Musculoskeletal: She  exhibits no edema.  Neurological: She is alert and oriented to person, place, and time. No cranial nerve deficit.  Skin: Skin is warm and dry.  Psychiatric: She has a normal mood and affect. Her behavior is normal. Judgment and thought content normal.  Nursing note and vitals reviewed.         Assessment & Plan:   Well woman exam - Plan: CBC with Differential/Platelet, Comprehensive metabolic panel, Lipid panel, TSH  HLD (hyperlipidemia)  Depression  Obesity  Need for hepatitis C screening test - Plan: Hepatitis C Antibody No Follow-up on file.

## 2016-05-11 LAB — HEPATITIS C ANTIBODY: HCV Ab: NEGATIVE

## 2016-06-28 DIAGNOSIS — B029 Zoster without complications: Secondary | ICD-10-CM | POA: Diagnosis not present

## 2016-07-12 ENCOUNTER — Ambulatory Visit (INDEPENDENT_AMBULATORY_CARE_PROVIDER_SITE_OTHER): Payer: BLUE CROSS/BLUE SHIELD | Admitting: Family Medicine

## 2016-07-12 ENCOUNTER — Encounter: Payer: Self-pay | Admitting: Family Medicine

## 2016-07-12 VITALS — BP 120/66 | HR 76 | Temp 98.4°F | Wt 203.5 lb

## 2016-07-12 DIAGNOSIS — J209 Acute bronchitis, unspecified: Secondary | ICD-10-CM | POA: Diagnosis not present

## 2016-07-12 DIAGNOSIS — Z23 Encounter for immunization: Secondary | ICD-10-CM

## 2016-07-12 MED ORDER — HYDROCOD POLST-CPM POLST ER 10-8 MG/5ML PO SUER: 5.0000 mL | Freq: Two times a day (BID) | ORAL | 0 refills | Status: DC | PRN

## 2016-07-12 MED ORDER — DOXYCYCLINE HYCLATE 100 MG PO TABS: 100.0000 mg | ORAL_TABLET | Freq: Two times a day (BID) | ORAL | 0 refills | Status: DC

## 2016-07-12 NOTE — Progress Notes (Signed)
SUBJECTIVE:  Kimberly Christian is a 60 y.o. female who complains of coryza, congestion, sneezing, sore throat and dry cough for 8 days. She denies a history of anorexia and chest pain and denies a history of asthma. Patient admits to smoke cigarettes.   Current Outpatient Prescriptions on File Prior to Visit  Medication Sig Dispense Refill  . citalopram (CELEXA) 40 MG tablet TAKE 1 BY MOUTH DAILY 90 tablet 1  . simvastatin (ZOCOR) 10 MG tablet TAKE 1 TABLET (10 MG TOTAL) BY MOUTH AT BEDTIME. (Patient not taking: Reported on 07/12/2016) 90 tablet 0   No current facility-administered medications on file prior to visit.     No Known Allergies  Past Medical History:  Diagnosis Date  . Allergy   . Depression   . Hyperlipidemia     Past Surgical History:  Procedure Laterality Date  . TUBAL LIGATION    . WISDOM TOOTH EXTRACTION      Family History  Problem Relation Age of Onset  . Cancer Mother     lung  . Pulmonary fibrosis Father     Social History   Social History  . Marital status: Single    Spouse name: N/A  . Number of children: N/A  . Years of education: N/A   Occupational History  . Not on file.   Social History Main Topics  . Smoking status: Current Every Day Smoker    Packs/day: 1.00    Years: 20.00    Types: Cigarettes  . Smokeless tobacco: Never Used  . Alcohol use 4.8 - 6.0 oz/week    8 - 10 Cans of beer per week     Comment: Occasional  . Drug use: No  . Sexual activity: Not on file   Other Topics Concern  . Not on file   Social History Narrative  . No narrative on file   The PMH, PSH, Social History, Family History, Medications, and allergies have been reviewed in [facility], and have been updated if relevant.  OBJECTIVE: BP 120/66   Pulse 76   Temp 98.4 F (36.9 C) (Oral)   Wt 203 lb 8 oz (92.3 kg)   SpO2 97%   BMI 38.45 kg/m   She appears well, vital signs are as noted. Ears normal.  Throat and pharynx normal.  Neck supple. No adenopathy in  the neck. Nose is congested. Sinuses non tender. Scattered wheezes and rhonchi  ASSESSMENT:  bronchitis  PLAN: Given duration and progression of symptoms in a smoker, will treat for bacterial process with 10 day course of doxycyline 100 mg twice daily, tussionex rx printed and given to pt.  Discussed sedation precautions.  Symptomatic therapy suggested: push fluids, rest and return office visit prn if symptoms persist or worsen.Call or return to clinic prn if these symptoms worsen or fail to improve as anticipated.

## 2016-07-12 NOTE — Addendum Note (Signed)
Addended by: Modena Nunnery on: 07/12/2016 01:16 PM   Modules accepted: Orders

## 2016-07-12 NOTE — Progress Notes (Signed)
Pre visit review using our clinic review tool, if applicable. No additional management support is needed unless otherwise documented below in the visit note. 

## 2016-12-13 ENCOUNTER — Other Ambulatory Visit: Payer: Self-pay

## 2016-12-13 MED ORDER — CITALOPRAM HYDROBROMIDE 40 MG PO TABS: ORAL_TABLET | ORAL | 1 refills | Status: DC

## 2016-12-13 NOTE — Telephone Encounter (Signed)
Pt left v/m requesting refill citalopram to [facility] [place]. Last annual 04/30/16. Pt notified refill done as requested.

## 2017-01-30 DIAGNOSIS — M545 Low back pain: Secondary | ICD-10-CM | POA: Diagnosis not present

## 2017-01-30 DIAGNOSIS — M25552 Pain in left hip: Secondary | ICD-10-CM | POA: Diagnosis not present

## 2017-01-30 DIAGNOSIS — M5417 Radiculopathy, lumbosacral region: Secondary | ICD-10-CM | POA: Diagnosis not present

## 2017-01-30 DIAGNOSIS — M5416 Radiculopathy, lumbar region: Secondary | ICD-10-CM | POA: Diagnosis not present

## 2017-01-31 DIAGNOSIS — M545 Low back pain: Secondary | ICD-10-CM | POA: Diagnosis not present

## 2017-01-31 DIAGNOSIS — M5416 Radiculopathy, lumbar region: Secondary | ICD-10-CM | POA: Diagnosis not present

## 2017-01-31 DIAGNOSIS — M5417 Radiculopathy, lumbosacral region: Secondary | ICD-10-CM | POA: Diagnosis not present

## 2017-01-31 DIAGNOSIS — M25552 Pain in left hip: Secondary | ICD-10-CM | POA: Diagnosis not present

## 2017-02-01 DIAGNOSIS — M545 Low back pain: Secondary | ICD-10-CM | POA: Diagnosis not present

## 2017-02-01 DIAGNOSIS — M5417 Radiculopathy, lumbosacral region: Secondary | ICD-10-CM | POA: Diagnosis not present

## 2017-02-01 DIAGNOSIS — M25552 Pain in left hip: Secondary | ICD-10-CM | POA: Diagnosis not present

## 2017-02-01 DIAGNOSIS — M5416 Radiculopathy, lumbar region: Secondary | ICD-10-CM | POA: Diagnosis not present

## 2017-02-05 DIAGNOSIS — M25552 Pain in left hip: Secondary | ICD-10-CM | POA: Diagnosis not present

## 2017-02-05 DIAGNOSIS — M545 Low back pain: Secondary | ICD-10-CM | POA: Diagnosis not present

## 2017-02-05 DIAGNOSIS — M5416 Radiculopathy, lumbar region: Secondary | ICD-10-CM | POA: Diagnosis not present

## 2017-02-05 DIAGNOSIS — M5417 Radiculopathy, lumbosacral region: Secondary | ICD-10-CM | POA: Diagnosis not present

## 2017-02-07 DIAGNOSIS — M5416 Radiculopathy, lumbar region: Secondary | ICD-10-CM | POA: Diagnosis not present

## 2017-02-07 DIAGNOSIS — M5417 Radiculopathy, lumbosacral region: Secondary | ICD-10-CM | POA: Diagnosis not present

## 2017-02-07 DIAGNOSIS — M25552 Pain in left hip: Secondary | ICD-10-CM | POA: Diagnosis not present

## 2017-02-07 DIAGNOSIS — M545 Low back pain: Secondary | ICD-10-CM | POA: Diagnosis not present

## 2017-02-08 DIAGNOSIS — M545 Low back pain: Secondary | ICD-10-CM | POA: Diagnosis not present

## 2017-02-08 DIAGNOSIS — M5416 Radiculopathy, lumbar region: Secondary | ICD-10-CM | POA: Diagnosis not present

## 2017-02-08 DIAGNOSIS — M25552 Pain in left hip: Secondary | ICD-10-CM | POA: Diagnosis not present

## 2017-02-08 DIAGNOSIS — M5417 Radiculopathy, lumbosacral region: Secondary | ICD-10-CM | POA: Diagnosis not present

## 2017-02-12 DIAGNOSIS — M5416 Radiculopathy, lumbar region: Secondary | ICD-10-CM | POA: Diagnosis not present

## 2017-02-12 DIAGNOSIS — M25552 Pain in left hip: Secondary | ICD-10-CM | POA: Diagnosis not present

## 2017-02-12 DIAGNOSIS — M5417 Radiculopathy, lumbosacral region: Secondary | ICD-10-CM | POA: Diagnosis not present

## 2017-02-12 DIAGNOSIS — M545 Low back pain: Secondary | ICD-10-CM | POA: Diagnosis not present

## 2017-02-14 DIAGNOSIS — M545 Low back pain: Secondary | ICD-10-CM | POA: Diagnosis not present

## 2017-02-14 DIAGNOSIS — M25552 Pain in left hip: Secondary | ICD-10-CM | POA: Diagnosis not present

## 2017-02-14 DIAGNOSIS — M5416 Radiculopathy, lumbar region: Secondary | ICD-10-CM | POA: Diagnosis not present

## 2017-02-14 DIAGNOSIS — M5417 Radiculopathy, lumbosacral region: Secondary | ICD-10-CM | POA: Diagnosis not present

## 2017-02-15 DIAGNOSIS — M545 Low back pain: Secondary | ICD-10-CM | POA: Diagnosis not present

## 2017-02-15 DIAGNOSIS — M5416 Radiculopathy, lumbar region: Secondary | ICD-10-CM | POA: Diagnosis not present

## 2017-02-15 DIAGNOSIS — M5417 Radiculopathy, lumbosacral region: Secondary | ICD-10-CM | POA: Diagnosis not present

## 2017-02-15 DIAGNOSIS — M25552 Pain in left hip: Secondary | ICD-10-CM | POA: Diagnosis not present

## 2017-02-26 DIAGNOSIS — M5416 Radiculopathy, lumbar region: Secondary | ICD-10-CM | POA: Diagnosis not present

## 2017-02-26 DIAGNOSIS — M545 Low back pain: Secondary | ICD-10-CM | POA: Diagnosis not present

## 2017-02-26 DIAGNOSIS — M25552 Pain in left hip: Secondary | ICD-10-CM | POA: Diagnosis not present

## 2017-02-26 DIAGNOSIS — M5417 Radiculopathy, lumbosacral region: Secondary | ICD-10-CM | POA: Diagnosis not present

## 2017-02-28 DIAGNOSIS — M545 Low back pain: Secondary | ICD-10-CM | POA: Diagnosis not present

## 2017-02-28 DIAGNOSIS — M25552 Pain in left hip: Secondary | ICD-10-CM | POA: Diagnosis not present

## 2017-02-28 DIAGNOSIS — M5416 Radiculopathy, lumbar region: Secondary | ICD-10-CM | POA: Diagnosis not present

## 2017-02-28 DIAGNOSIS — M5417 Radiculopathy, lumbosacral region: Secondary | ICD-10-CM | POA: Diagnosis not present

## 2017-03-01 DIAGNOSIS — M5416 Radiculopathy, lumbar region: Secondary | ICD-10-CM | POA: Diagnosis not present

## 2017-03-01 DIAGNOSIS — M545 Low back pain: Secondary | ICD-10-CM | POA: Diagnosis not present

## 2017-03-01 DIAGNOSIS — M25552 Pain in left hip: Secondary | ICD-10-CM | POA: Diagnosis not present

## 2017-03-01 DIAGNOSIS — M5417 Radiculopathy, lumbosacral region: Secondary | ICD-10-CM | POA: Diagnosis not present

## 2017-03-06 DIAGNOSIS — M5417 Radiculopathy, lumbosacral region: Secondary | ICD-10-CM | POA: Diagnosis not present

## 2017-03-06 DIAGNOSIS — M5416 Radiculopathy, lumbar region: Secondary | ICD-10-CM | POA: Diagnosis not present

## 2017-03-06 DIAGNOSIS — M25552 Pain in left hip: Secondary | ICD-10-CM | POA: Diagnosis not present

## 2017-03-06 DIAGNOSIS — M545 Low back pain: Secondary | ICD-10-CM | POA: Diagnosis not present

## 2017-03-08 DIAGNOSIS — M5416 Radiculopathy, lumbar region: Secondary | ICD-10-CM | POA: Diagnosis not present

## 2017-03-08 DIAGNOSIS — M25552 Pain in left hip: Secondary | ICD-10-CM | POA: Diagnosis not present

## 2017-03-08 DIAGNOSIS — M545 Low back pain: Secondary | ICD-10-CM | POA: Diagnosis not present

## 2017-03-08 DIAGNOSIS — M5417 Radiculopathy, lumbosacral region: Secondary | ICD-10-CM | POA: Diagnosis not present

## 2017-03-13 DIAGNOSIS — M545 Low back pain: Secondary | ICD-10-CM | POA: Diagnosis not present

## 2017-03-13 DIAGNOSIS — M25552 Pain in left hip: Secondary | ICD-10-CM | POA: Diagnosis not present

## 2017-03-13 DIAGNOSIS — M5417 Radiculopathy, lumbosacral region: Secondary | ICD-10-CM | POA: Diagnosis not present

## 2017-03-13 DIAGNOSIS — M5416 Radiculopathy, lumbar region: Secondary | ICD-10-CM | POA: Diagnosis not present

## 2017-03-15 DIAGNOSIS — M5417 Radiculopathy, lumbosacral region: Secondary | ICD-10-CM | POA: Diagnosis not present

## 2017-03-15 DIAGNOSIS — M545 Low back pain: Secondary | ICD-10-CM | POA: Diagnosis not present

## 2017-03-15 DIAGNOSIS — M5416 Radiculopathy, lumbar region: Secondary | ICD-10-CM | POA: Diagnosis not present

## 2017-03-15 DIAGNOSIS — M25552 Pain in left hip: Secondary | ICD-10-CM | POA: Diagnosis not present

## 2017-03-20 DIAGNOSIS — M5417 Radiculopathy, lumbosacral region: Secondary | ICD-10-CM | POA: Diagnosis not present

## 2017-03-20 DIAGNOSIS — M25552 Pain in left hip: Secondary | ICD-10-CM | POA: Diagnosis not present

## 2017-03-20 DIAGNOSIS — M5416 Radiculopathy, lumbar region: Secondary | ICD-10-CM | POA: Diagnosis not present

## 2017-03-20 DIAGNOSIS — M545 Low back pain: Secondary | ICD-10-CM | POA: Diagnosis not present

## 2017-03-22 DIAGNOSIS — M5417 Radiculopathy, lumbosacral region: Secondary | ICD-10-CM | POA: Diagnosis not present

## 2017-03-22 DIAGNOSIS — M25552 Pain in left hip: Secondary | ICD-10-CM | POA: Diagnosis not present

## 2017-03-22 DIAGNOSIS — M5416 Radiculopathy, lumbar region: Secondary | ICD-10-CM | POA: Diagnosis not present

## 2017-03-22 DIAGNOSIS — M545 Low back pain: Secondary | ICD-10-CM | POA: Diagnosis not present

## 2017-04-02 ENCOUNTER — Encounter (HOSPITAL_COMMUNITY): Payer: Self-pay

## 2017-04-02 ENCOUNTER — Observation Stay (HOSPITAL_COMMUNITY)
Admission: EM | Admit: 2017-04-02 | Discharge: 2017-04-05 | Disposition: A | Discharge status: Home / Self Care | Payer: BLUE CROSS/BLUE SHIELD | Attending: Internal Medicine | Admitting: Internal Medicine

## 2017-04-02 ENCOUNTER — Telehealth: Payer: Self-pay | Admitting: Family Medicine

## 2017-04-02 ENCOUNTER — Observation Stay (HOSPITAL_COMMUNITY): Payer: BLUE CROSS/BLUE SHIELD

## 2017-04-02 ENCOUNTER — Ambulatory Visit: Payer: BLUE CROSS/BLUE SHIELD | Admitting: Family Medicine

## 2017-04-02 ENCOUNTER — Emergency Department (HOSPITAL_COMMUNITY): Payer: BLUE CROSS/BLUE SHIELD

## 2017-04-02 DIAGNOSIS — R778 Other specified abnormalities of plasma proteins: Secondary | ICD-10-CM

## 2017-04-02 DIAGNOSIS — E1165 Type 2 diabetes mellitus with hyperglycemia: Secondary | ICD-10-CM | POA: Insufficient documentation

## 2017-04-02 DIAGNOSIS — R739 Hyperglycemia, unspecified: Secondary | ICD-10-CM | POA: Diagnosis not present

## 2017-04-02 DIAGNOSIS — F17210 Nicotine dependence, cigarettes, uncomplicated: Secondary | ICD-10-CM | POA: Insufficient documentation

## 2017-04-02 DIAGNOSIS — R0789 Other chest pain: Secondary | ICD-10-CM | POA: Diagnosis not present

## 2017-04-02 DIAGNOSIS — F329 Major depressive disorder, single episode, unspecified: Secondary | ICD-10-CM | POA: Diagnosis not present

## 2017-04-02 DIAGNOSIS — R7989 Other specified abnormal findings of blood chemistry: Secondary | ICD-10-CM

## 2017-04-02 DIAGNOSIS — I10 Essential (primary) hypertension: Secondary | ICD-10-CM | POA: Diagnosis not present

## 2017-04-02 DIAGNOSIS — R079 Chest pain, unspecified: Secondary | ICD-10-CM | POA: Diagnosis present

## 2017-04-02 DIAGNOSIS — I160 Hypertensive urgency: Secondary | ICD-10-CM | POA: Diagnosis not present

## 2017-04-02 DIAGNOSIS — R911 Solitary pulmonary nodule: Secondary | ICD-10-CM

## 2017-04-02 DIAGNOSIS — F17200 Nicotine dependence, unspecified, uncomplicated: Secondary | ICD-10-CM | POA: Diagnosis not present

## 2017-04-02 DIAGNOSIS — F32A Depression, unspecified: Secondary | ICD-10-CM | POA: Diagnosis present

## 2017-04-02 DIAGNOSIS — E785 Hyperlipidemia, unspecified: Secondary | ICD-10-CM | POA: Diagnosis present

## 2017-04-02 LAB — CBC
HCT: 46.8 % — ABNORMAL HIGH (ref 36.0–46.0)
Hemoglobin: 16.1 g/dL — ABNORMAL HIGH (ref 12.0–15.0)
MCH: 34.3 pg — ABNORMAL HIGH (ref 26.0–34.0)
MCHC: 34.4 g/dL (ref 30.0–36.0)
MCV: 99.8 fL (ref 78.0–100.0)
Platelets: 193 10*3/uL (ref 150–400)
RBC: 4.69 MIL/uL (ref 3.87–5.11)
RDW: 12.9 % (ref 11.5–15.5)
WBC: 7.8 10*3/uL (ref 4.0–10.5)

## 2017-04-02 LAB — I-STAT TROPONIN, ED
Troponin i, poc: 0 ng/mL (ref 0.00–0.08)
Troponin i, poc: 0.01 ng/mL (ref 0.00–0.08)

## 2017-04-02 LAB — BASIC METABOLIC PANEL
Anion gap: 11 (ref 5–15)
BUN: 7 mg/dL (ref 6–20)
CO2: 24 mmol/L (ref 22–32)
Calcium: 9 mg/dL (ref 8.9–10.3)
Chloride: 99 mmol/L — ABNORMAL LOW (ref 101–111)
Creatinine, Ser: 0.94 mg/dL (ref 0.44–1.00)
GFR calc Af Amer: >60 mL/min (ref >60)
GFR calc non Af Amer: >60 mL/min (ref >60)
Glucose, Bld: 188 mg/dL — ABNORMAL HIGH (ref 65–99)
Potassium: 3.8 mmol/L (ref 3.5–5.1)
Sodium: 134 mmol/L — ABNORMAL LOW (ref 135–145)

## 2017-04-02 MED ORDER — LABETALOL HCL 5 MG/ML IV SOLN
10.0000 mg | Freq: Once | INTRAVENOUS | Status: AC
  Administered 2017-04-02: 10 mg via INTRAVENOUS
  Filled 2017-04-02: qty 4

## 2017-04-02 MED ORDER — LABETALOL HCL 5 MG/ML IV SOLN
10.0000 mg | INTRAVENOUS | Status: DC | PRN
  Administered 2017-04-04: 10 mg via INTRAVENOUS
  Filled 2017-04-02 (×2): qty 4

## 2017-04-02 MED ORDER — ONDANSETRON HCL 4 MG/2ML IJ SOLN
4.0000 mg | Freq: Four times a day (QID) | INTRAMUSCULAR | Status: DC | PRN
  Administered 2017-04-04: 4 mg via INTRAVENOUS
  Filled 2017-04-02: qty 2

## 2017-04-02 MED ORDER — ASPIRIN EC 81 MG PO TBEC
81.0000 mg | DELAYED_RELEASE_TABLET | Freq: Every day | ORAL | Status: DC
  Administered 2017-04-03 – 2017-04-05 (×3): 81 mg via ORAL
  Filled 2017-04-02 (×3): qty 1

## 2017-04-02 MED ORDER — ACETAMINOPHEN 325 MG PO TABS
650.0000 mg | ORAL_TABLET | ORAL | Status: DC | PRN
  Administered 2017-04-03: 650 mg via ORAL
  Filled 2017-04-02: qty 2

## 2017-04-02 MED ORDER — ASPIRIN 300 MG RE SUPP: 300.0000 mg | RECTAL | Status: AC

## 2017-04-02 MED ORDER — INSULIN ASPART 100 UNIT/ML ~~LOC~~ SOLN: 0.0000 [IU] | Freq: Every day | SUBCUTANEOUS | Status: DC

## 2017-04-02 MED ORDER — ENOXAPARIN SODIUM 40 MG/0.4ML ~~LOC~~ SOLN
40.0000 mg | SUBCUTANEOUS | Status: DC
  Administered 2017-04-03 – 2017-04-04 (×2): 40 mg via SUBCUTANEOUS
  Filled 2017-04-02 (×3): qty 0.4

## 2017-04-02 MED ORDER — CITALOPRAM HYDROBROMIDE 10 MG PO TABS
40.0000 mg | ORAL_TABLET | Freq: Every day | ORAL | Status: DC
  Administered 2017-04-03 – 2017-04-04 (×2): 40 mg via ORAL
  Filled 2017-04-02 (×2): qty 4

## 2017-04-02 MED ORDER — NITROGLYCERIN 0.4 MG SL SUBL
0.4000 mg | SUBLINGUAL_TABLET | SUBLINGUAL | Status: DC | PRN
Start: 2017-04-02 — End: 2017-04-05

## 2017-04-02 MED ORDER — NICOTINE POLACRILEX 2 MG MT GUM
2.0000 mg | CHEWING_GUM | OROMUCOSAL | Status: DC | PRN
  Filled 2017-04-02: qty 1

## 2017-04-02 MED ORDER — LABETALOL HCL 5 MG/ML IV SOLN
10.0000 mg | Freq: Once | INTRAVENOUS | Status: AC
  Administered 2017-04-02: 10 mg via INTRAVENOUS

## 2017-04-02 MED ORDER — SODIUM CHLORIDE 0.9 % IV SOLN: 250.0000 mL | INTRAVENOUS | Status: DC | PRN

## 2017-04-02 MED ORDER — INSULIN ASPART 100 UNIT/ML ~~LOC~~ SOLN: 0.0000 [IU] | Freq: Three times a day (TID) | SUBCUTANEOUS | Status: DC

## 2017-04-02 MED ORDER — HYDROCODONE-ACETAMINOPHEN 7.5-325 MG PO TABS
1.0000 | ORAL_TABLET | ORAL | Status: DC | PRN
  Administered 2017-04-03 – 2017-04-04 (×2): 2 via ORAL
  Filled 2017-04-02 (×2): qty 2

## 2017-04-02 MED ORDER — ASPIRIN 81 MG PO CHEW
324.0000 mg | CHEWABLE_TABLET | ORAL | Status: AC
  Administered 2017-04-03: 324 mg via ORAL
  Filled 2017-04-02: qty 4

## 2017-04-02 MED ORDER — SODIUM CHLORIDE 0.9% FLUSH: 3.0000 mL | INTRAVENOUS | Status: DC | PRN

## 2017-04-02 MED ORDER — SODIUM CHLORIDE 0.9% FLUSH
3.0000 mL | Freq: Two times a day (BID) | INTRAVENOUS | Status: DC
  Administered 2017-04-02 – 2017-04-05 (×6): 3 mL via INTRAVENOUS

## 2017-04-02 MED ORDER — AMLODIPINE BESYLATE 5 MG PO TABS
10.0000 mg | ORAL_TABLET | Freq: Once | ORAL | Status: AC
  Administered 2017-04-03: 10 mg via ORAL
  Filled 2017-04-02: qty 2

## 2017-04-02 MED ORDER — HYDRALAZINE HCL 20 MG/ML IJ SOLN
10.0000 mg | INTRAMUSCULAR | Status: DC | PRN
  Administered 2017-04-04: 10 mg via INTRAVENOUS
  Filled 2017-04-02: qty 1

## 2017-04-02 NOTE — ED Provider Notes (Signed)
[facility] DEPT Provider Note   CSN: [id number] Arrival date & time: 04/02/17  1129     History   Chief Complaint Chief Complaint  Patient presents with  . Headache  . Hypertension  . Chest Pain    HPI Kimberly Christian is a 61 y.o. female.  The history is provided by the patient.  Illness  This is a new problem. The current episode started 12 to 24 hours ago. The problem occurs constantly. The problem has not changed since onset.Associated symptoms include chest pain and headaches. Pertinent negatives include no abdominal pain and no shortness of breath. Associated symptoms comments: Elevated blood pressure, headache. Nothing aggravates the symptoms. Nothing relieves the symptoms. She has tried nothing for the symptoms. The treatment provided no relief.    Past Medical History:  Diagnosis Date  . Allergy   . Depression   . Hyperlipidemia     Patient Active Problem List   Diagnosis Date Noted  . Hypertensive urgency 04/02/2017  . Chest pain 04/02/2017  . Incidental lung nodule 04/02/2017  . Depression 05/26/2015  . Obesity 04/16/2014  . Hyperglycemia 04/16/2014  . HLD (hyperlipidemia) 02/13/2013  . TOBACCO ABUSE 11/22/2010  . ALLERGIC RHINITIS 11/22/2010    Past Surgical History:  Procedure Laterality Date  . TUBAL LIGATION    . WISDOM TOOTH EXTRACTION      OB History    No data available       Home Medications    Prior to Admission medications   Medication Sig Start Date End Date Taking? Authorizing Provider  citalopram (CELEXA) 40 MG tablet TAKE 1 BY MOUTH DAILY Patient taking differently: Take 40 mg by mouth daily.  12/13/16  Yes Lucille Passy, MD  ibuprofen (ADVIL,MOTRIN) 200 MG tablet Take 800 mg by mouth every 6 (six) hours as needed for mild pain.   Yes [provider]  chlorpheniramine-HYDROcodone (TUSSIONEX PENNKINETIC ER) 10-8 MG/5ML SUER Take 5 mLs by mouth every 12 (twelve) hours as needed. Patient not taking: Reported on 04/02/2017  07/12/16   Lucille Passy, MD  simvastatin (ZOCOR) 10 MG tablet TAKE 1 TABLET (10 MG TOTAL) BY MOUTH AT BEDTIME. Patient not taking: Reported on 07/12/2016 04/16/14   Lucille Passy, MD    Family History Family History  Problem Relation Age of Onset  . Cancer Mother        lung  . Pulmonary fibrosis Father     Social History Social History  Substance Use Topics  . Smoking status: Current Every Day Smoker    Packs/day: 1.00    Years: 20.00    Types: Cigarettes  . Smokeless tobacco: Never Used  . Alcohol use 4.8 - 6.0 oz/week    8 - 10 Cans of beer per week     Comment: Occasional     Allergies   Patient has no known allergies.   Review of Systems Review of Systems  Constitutional: Negative for chills and fever.  HENT: Negative for ear pain and sore throat.   Eyes: Negative for pain and visual disturbance.  Respiratory: Negative for cough and shortness of breath.   Cardiovascular: Positive for chest pain. Negative for palpitations.  Gastrointestinal: Negative for abdominal pain, blood in stool, constipation, diarrhea, nausea and vomiting.  Genitourinary: Negative for decreased urine volume, dysuria and hematuria.  Musculoskeletal: Negative for arthralgias and back pain.  Skin: Negative for color change and rash.  Neurological: Positive for headaches. Negative for seizures and syncope.  All other systems reviewed and  are negative.    Physical Exam Updated Vital Signs BP (!) 176/107   Pulse 75   Temp 98.8 F (37.1 C) (Oral)   Resp (!) 21   Ht 5\' 1"  (1.549 m)   Wt 88.5 kg (195 lb)   SpO2 97%   BMI 36.84 kg/m   Physical Exam  Constitutional: She is oriented to person, place, and time. She appears well-developed and well-nourished. No distress.  HENT:  Head: Normocephalic and atraumatic.  Eyes: Conjunctivae and EOM are normal. Pupils are equal, round, and reactive to light.  Neck: Normal range of motion. Neck supple.  Cardiovascular: Normal rate, regular rhythm  and intact distal pulses.   No murmur heard. Pulmonary/Chest: Effort normal and breath sounds normal. No respiratory distress.  Abdominal: Soft. There is no tenderness.  Musculoskeletal: She exhibits no edema.  Neurological: She is alert and oriented to person, place, and time.  Skin: Skin is warm and dry.  Psychiatric: She has a normal mood and affect.  Nursing note and vitals reviewed.    ED Treatments / Results  Labs (all labs ordered are listed, but only abnormal results are displayed) Labs Reviewed  BASIC METABOLIC PANEL - Abnormal; Notable for the following:       Result Value   Sodium 134 (*)    Chloride 99 (*)    Glucose, Bld 188 (*)    All other components within normal limits  CBC - Abnormal; Notable for the following:    Hemoglobin 16.1 (*)    HCT 46.8 (*)    MCH 34.3 (*)    All other components within normal limits  URINALYSIS, ROUTINE W REFLEX MICROSCOPIC  RAPID URINE DRUG SCREEN, HOSP PERFORMED  HIV ANTIBODY (ROUTINE TESTING)  TSH  TROPONIN I  TROPONIN I  TROPONIN I  BRAIN NATRIURETIC PEPTIDE  CBC WITH DIFFERENTIAL/PLATELET  BASIC METABOLIC PANEL  I-STAT TROPOININ, ED  I-STAT TROPOININ, ED    EKG  EKG Interpretation  Date/Time:  Monday April 02 2017 11:43:19 EDT Ventricular Rate:  92 PR Interval:  110 QRS Duration: 70 QT Interval:  190 QTC Calculation: 234 R Axis:   52 Text Interpretation:  Sinus rhythm with short PR Cannot rule out Anterior infarct , age undetermined Abnormal ECG No STEMI. No old for comparison.  Confirmed by Nanda Quinton [phone]) on 04/02/2017 6:29:37 PM       Radiology Dg Chest 2 View  Result Date: 04/02/2017 CLINICAL DATA:  Chest pressure. EXAM: CHEST  2 VIEW COMPARISON:  None. FINDINGS: Generalized interstitial coarsening, favored bronchitic. There is no vascular pedicle widening or Kerley lines. No pleural effusion or cardiomegaly. No air bronchograms. There is an ovoid opacity projecting posterior to the trachea in the  lateral projection, nearly 3 cm in length. No acute osseous finding. IMPRESSION: 1. Unexpected nodular density in the lateral view, recommend chest CT in this smoker. 2. Interstitial coarsening, likely bronchitic. Electronically Signed   By: Monte Fantasia M.D.   On: 04/02/2017 12:27    Procedures Procedures (including critical care time)  Medications Ordered in ED Medications  citalopram (CELEXA) tablet 40 mg (not administered)  aspirin chewable tablet 324 mg (not administered)    Or  aspirin suppository 300 mg (not administered)  aspirin EC tablet 81 mg (not administered)  nitroGLYCERIN (NITROSTAT) SL tablet 0.4 mg (not administered)  acetaminophen (TYLENOL) tablet 650 mg (not administered)  ondansetron (ZOFRAN) injection 4 mg (not administered)  enoxaparin (LOVENOX) injection 40 mg (not administered)  sodium chloride flush (NS) 0.9 %  injection 3 mL (3 mLs Intravenous Given 04/02/17 2211)  sodium chloride flush (NS) 0.9 % injection 3 mL (not administered)  0.9 %  sodium chloride infusion (not administered)  labetalol (NORMODYNE,TRANDATE) injection 10 mg (not administered)  hydrALAZINE (APRESOLINE) injection 10 mg (not administered)  labetalol (NORMODYNE,TRANDATE) injection 10 mg (10 mg Intravenous Given 04/02/17 1904)  labetalol (NORMODYNE,TRANDATE) injection 10 mg (10 mg Intravenous Given 04/02/17 1952)     Initial Impression / Assessment and Plan / ED Course  I have reviewed the triage vital signs and the nursing notes.  Pertinent labs & imaging results that were available during my care of the patient were reviewed by me and considered in my medical decision making (see chart for details).     ALIANYS CHACKO is a 61 year old female with no significant history coming in today with headache. Patient states has been going on for 2 weeks. Patient checked her blood pressure this morning she said it was 220s over 110s. Patient denies any history of hypertension and sees a physician  yearly. She denies any nausea, vomiting, shortness of breath. Initially endorse some pressure in her chest earlier this morning but said it was very short and is currently asymptomatic with no chest pain.  On exam, patient is in no apparent distress and is alert and oriented. No respiratory distress. Hypertensive to the 230s over 120s. Given 10 of labetalol. Upon recheck 45 minutes later, patient's pressure still in the 200s over 100 so she was given an additional 10 mg labetalol. Pressure then decreased to 170s over 90s, which is an appropriate range for her at this time.EKG reassuring. Chest x-ray unremarkable for acute findings, but does show a suspicious nodule, and CT chest without contrast has already been ordered per chart review. Considering patient with some chest pain associated with her high blood pressure as well as headaches and no history of hypertension, Do believe she needs admission for further workup and aggressive blood pressure control.  She will be admitted to the hospitalist service for further management.  Patient was seen with my attending, Dr. Laverta Baltimore, who voiced agreement and oversaw the evaluation and treatment of this patient.   [employer] seismologist was used in the creation of this note. If there are any errors or inconsistencies needing clarification, please contact me directly.   Final Clinical Impressions(s) / ED Diagnoses   Final diagnoses:  Hypertension, unspecified type    New Prescriptions New Prescriptions   No medications on file     Valda Lamb, MD 04/02/17 2259    Margette Fast, MD 04/03/17 Shelah Lewandowsky

## 2017-04-02 NOTE — H&P (Signed)
History and Physical    TAMIEKA RANCOURT HEN:[id number] DOB: July 27, 1956 DOA: 04/02/2017  PCP: Lucille Passy, MD   Patient coming from: Home  Chief Complaint: Chest pressure, high BP   HPI: ELLIOTT QUADE is a 61 y.o. female with medical history significant for depression, hyperlipidemia, and tobacco abuse, now presenting to the emergency department for evaluation of intermittent headaches and elevated blood pressure. Patient reports experiencing mild intermittent frontal headaches for the past couple weeks, noted to be unusual for her, but responding well to ibuprofen at home. She then developed some pressure sensation in the central chest this morning which lasted approximately 2 hours, was described as mild to moderate in intensity, localized to the central chest, not associated with diaphoresis, nausea, or shortness of breath, and with no alleviating or exacerbating factors identified. This resolved spontaneously. She decided to check her blood pressure today, found her systolic pressures to be in the 180s, and called her PCP for advice. PCP directed the patient to the emergency department for evaluation. Ms. Gallego denies any palpitations, denies any fevers or chills, and denies any change in her vision, hearing, or focal numbness or weakness. Denies any use of alcohol or illicit substances. She does not take any prescription medications. Headache is described as mild to moderate in intensity, dull and aching in character, localized to the frontal region, alleviated with ibuprofen, and with no exacerbating factors identified.  ED Course: Upon arrival to the ED, patient is found to be afebrile, saturating well on room air, hypertensive to 230/120, and with vital signs otherwise stable. EKG features sinus rhythm with borderline T-wave abnormalities. Chest x-ray is notable for interstitial coarsening, likely bronchitic, as well as a 3 cm nodular density posterior to the trachea on the lateral film.  Chemistry panels notable for sodium of 134 and glucose of 188. CBC features a hemoglobin of 16.1. Troponin is undetectable. Patient was treated with labetalol 10 mg IV push in the emergency department with improvement in her blood pressure 176/100. She remained moderately hypertensive in the ED, but in no apparent respiratory distress, and with no further chest discomfort or dyspnea. She will be observed on the stepdown unit for ongoing evaluation and management of hypertensive urgency with chest pressure and risk factors for coronary artery disease.  Review of Systems:  All other systems reviewed and apart from HPI, are negative.  Past Medical History:  Diagnosis Date  . Allergy   . Depression   . Hyperlipidemia     Past Surgical History:  Procedure Laterality Date  . TUBAL LIGATION    . WISDOM TOOTH EXTRACTION       reports that she has been smoking Cigarettes.  She has a 20.00 pack-year smoking history. She has never used smokeless tobacco. She reports that she drinks about 4.8 - 6.0 oz of alcohol per week . She reports that she does not use drugs.  No Known Allergies  Family History  Problem Relation Age of Onset  . Cancer Mother        lung  . Pulmonary fibrosis Father      Prior to Admission medications   Medication Sig Start Date End Date Taking? Authorizing Provider  citalopram (CELEXA) 40 MG tablet TAKE 1 BY MOUTH DAILY Patient taking differently: Take 40 mg by mouth daily.  12/13/16  Yes Lucille Passy, MD  ibuprofen (ADVIL,MOTRIN) 200 MG tablet Take 800 mg by mouth every 6 (six) hours as needed for mild pain.   Yes [provider]  chlorpheniramine-HYDROcodone (TUSSIONEX PENNKINETIC ER) 10-8 MG/5ML SUER Take 5 mLs by mouth every 12 (twelve) hours as needed. Patient not taking: Reported on 04/02/2017 07/12/16   Lucille Passy, MD  simvastatin (ZOCOR) 10 MG tablet TAKE 1 TABLET (10 MG TOTAL) BY MOUTH AT BEDTIME. Patient not taking: Reported on 07/12/2016 04/16/14    Lucille Passy, MD    Physical Exam: Vitals:   04/02/17 1930 04/02/17 1945 04/02/17 2000 04/02/17 2015  BP: (!) 208/112 (!) 202/103 (!) 184/86 (!) 176/107  Pulse: 79 71 75 75  Resp: 20 20 (!) 21 (!) 21  Temp:      TempSrc:      SpO2: 93% (!) 89% 94% 97%  Weight:      Height:          Constitutional: NAD, calm, comfortable Eyes: PERTLA, lids and conjunctivae normal ENMT: Mucous membranes are moist. Posterior pharynx clear of any exudate or lesions.   Neck: normal, supple, no masses, no thyromegaly Respiratory: clear to auscultation bilaterally, no wheezing, no crackles. Normal respiratory effort.  Cardiovascular: S1 & S2 heard, regular rate and rhythm. No extremity edema. No significant JVD. Abdomen: No distension, no tenderness, no masses palpated. Bowel sounds normal.  Musculoskeletal: no clubbing / cyanosis. No joint deformity upper and lower extremities.    Skin: no significant rashes, lesions, ulcers. Warm, dry, well-perfused. Neurologic: CN 2-12 grossly intact. Sensation intact, DTR normal. Strength 5/5 in all 4 limbs.  Psychiatric: Alert and oriented x 3. Pleasant and cooperative.     Labs on Admission: I have personally reviewed following labs and imaging studies  CBC:  Recent Labs Lab 04/02/17 1139  WBC 7.8  HGB 16.1*  HCT 46.8*  MCV 99.8  PLT 784   Basic Metabolic Panel:  Recent Labs Lab 04/02/17 1139  NA 134*  K 3.8  CL 99*  CO2 24  GLUCOSE 188*  BUN 7  CREATININE 0.94  CALCIUM 9.0   GFR: Estimated Creatinine Clearance: 65.2 mL/min (by C-G formula based on SCr of 0.94 mg/dL). Liver Function Tests: No results for input(s): AST, ALT, ALKPHOS, BILITOT, PROT, ALBUMIN in the last 168 hours. No results for input(s): LIPASE, AMYLASE in the last 168 hours. No results for input(s): AMMONIA in the last 168 hours. Coagulation Profile: No results for input(s): INR, PROTIME in the last 168 hours. Cardiac Enzymes: No results for input(s): CKTOTAL, CKMB,  CKMBINDEX, TROPONINI in the last 168 hours. BNP (last 3 results) No results for input(s): PROBNP in the last 8760 hours. HbA1C: No results for input(s): HGBA1C in the last 72 hours. CBG: No results for input(s): GLUCAP in the last 168 hours. Lipid Profile: No results for input(s): CHOL, HDL, LDLCALC, TRIG, CHOLHDL, LDLDIRECT in the last 72 hours. Thyroid Function Tests: No results for input(s): TSH, T4TOTAL, FREET4, T3FREE, THYROIDAB in the last 72 hours. Anemia Panel: No results for input(s): VITAMINB12, FOLATE, FERRITIN, TIBC, IRON, RETICCTPCT in the last 72 hours. Urine analysis: No results found for: COLORURINE, APPEARANCEUR, LABSPEC, PHURINE, GLUCOSEU, HGBUR, BILIRUBINUR, KETONESUR, PROTEINUR, UROBILINOGEN, NITRITE, LEUKOCYTESUR Sepsis Labs: @LABRCNTIP (procalcitonin:4,lacticidven:4) )No results found for this or any previous visit (from the past 240 hour(s)).   Radiological Exams on Admission: Dg Chest 2 View  Result Date: 04/02/2017 CLINICAL DATA:  Chest pressure. EXAM: CHEST  2 VIEW COMPARISON:  None. FINDINGS: Generalized interstitial coarsening, favored bronchitic. There is no vascular pedicle widening or Kerley lines. No pleural effusion or cardiomegaly. No air bronchograms. There is an ovoid opacity projecting posterior to the  trachea in the lateral projection, nearly 3 cm in length. No acute osseous finding. IMPRESSION: 1. Unexpected nodular density in the lateral view, recommend chest CT in this smoker. 2. Interstitial coarsening, likely bronchitic. Electronically Signed   By: Monte Fantasia M.D.   On: 04/02/2017 12:27    EKG: Independently reviewed. Sinus rhythm, borderline T-wave abnormalities.   Assessment/Plan  1. Chest pressure - Pt presents following an episode of chest pressure in the setting of marked elevation in BP  - No known hx of CAD or HTN, though has risk-factors including HLD and tobacco abuse; likely has HTN as well, and with serum glucose of 810 DM is  certainly possible  - ED workup is reassuring with an undetectable troponin and EKG with only borderline T-wave abnormalities   - Plan to treat with ASA 324, control BP as below, monitor on telemetry, obtain serial troponin measurements, and repeat EKG in am (sooner prn)  2. Hypertensive urgency  - Pt denies hx of HTN, noted to have pressures in 220/120 range in ED  - No focal neurologic deficits, acute CHF, or renal impairment  - Treated with labetalol IVP in ED with improvement to 175/100 range before trending back up  - Plan to give a Norvasc 10 mg now, continue prn labetalol IVP's with goal BP of ~170/95 for now   3. Incidental lung nodule  - CXR notable for ~3 cm nodule posterior to trachea on lateral film  - Conceivable that this may contribute to chest pressure or hypertensive urgency, so will eval with inpatient CT chest   4. Depression  - Stable, well-controlled with Celexa, will continue   5. Current smoker  - Smoking ~ppd, had not really been eager to quit, but with newly identified lung nodule she wants to stop  - RN to provide smoking cessation information  - Nicotine replacement prn    6. Hyperglycemia  - Serum glucose 188 on admission - Check CBG's with meals and qHS; use a low-intensity Novolog correctional as needed    DVT prophylaxis: sq Lovenox  Code Status: Full  Family Communication: Husband updated at bedside Disposition Plan: Observe on telemetry Consults called: None Admission status: Observation    Vianne Bulls, MD [facility] Pager [phone]  If 7PM-7AM, please contact night-coverage [email or web address] Password [email or web address]  04/02/2017, 9:45 PM

## 2017-04-02 NOTE — ED Triage Notes (Addendum)
Pt endorses intermittent headache x 2 weeks, pt checked her BP this morning and it was 198/117 and has no hx of htn. Pt has been taking ibuprofen for headache with relief. No neuro deficits. Pt also states that she has had some chest pressure since this morning.

## 2017-04-02 NOTE — Telephone Encounter (Signed)
Patient Name: Kimberly Christian  DOB: [birth year]    Initial Comment Caller says , bp is moving around today, 196/117 with a bad headache , Her appt is at 3 pm today    Nurse Assessment  Nurse: Leilani Merl, RN, Heather Date/Time (Eastern Time): 04/02/2017 10:56:59 AM  Confirm and document reason for call. If symptomatic, describe symptoms. ---. Caller says , bp is moving around today, 196/117 with a bad headache , Her appt is at 3 pm today  Does the patient have any new or worsening symptoms? ---Yes  Will a triage be completed? ---Yes  Related visit to physician within the last 2 weeks? ---No  Does the PT have any chronic conditions? (i.e. diabetes, asthma, etc.) ---Yes  List chronic conditions. ---See MR  Is this a behavioral health or substance abuse call? ---No     Guidelines    Guideline Title Affirmed Question Affirmed Notes  High Blood Pressure [5] Systolic BP >= 643 OR Diastolic >= 329 AND [5] cardiac or neurologic symptoms (e.g., chest pain, difficulty breathing, unsteady gait, blurred vision)    Final Disposition User   Go to ED Now [place], RN, [facility] - ED   Disagree/Comply: Comply

## 2017-04-03 ENCOUNTER — Observation Stay (HOSPITAL_BASED_OUTPATIENT_CLINIC_OR_DEPARTMENT_OTHER): Payer: BLUE CROSS/BLUE SHIELD

## 2017-04-03 DIAGNOSIS — R748 Abnormal levels of other serum enzymes: Secondary | ICD-10-CM | POA: Diagnosis not present

## 2017-04-03 DIAGNOSIS — I10 Essential (primary) hypertension: Secondary | ICD-10-CM

## 2017-04-03 DIAGNOSIS — R079 Chest pain, unspecified: Secondary | ICD-10-CM

## 2017-04-03 DIAGNOSIS — I160 Hypertensive urgency: Secondary | ICD-10-CM

## 2017-04-03 DIAGNOSIS — R072 Precordial pain: Secondary | ICD-10-CM | POA: Diagnosis not present

## 2017-04-03 DIAGNOSIS — E7800 Pure hypercholesterolemia, unspecified: Secondary | ICD-10-CM | POA: Diagnosis not present

## 2017-04-03 DIAGNOSIS — F17200 Nicotine dependence, unspecified, uncomplicated: Secondary | ICD-10-CM | POA: Diagnosis not present

## 2017-04-03 DIAGNOSIS — F17210 Nicotine dependence, cigarettes, uncomplicated: Secondary | ICD-10-CM | POA: Diagnosis not present

## 2017-04-03 DIAGNOSIS — E1165 Type 2 diabetes mellitus with hyperglycemia: Secondary | ICD-10-CM | POA: Diagnosis not present

## 2017-04-03 LAB — ECHOCARDIOGRAM COMPLETE
E decel time: 201 msec
FS: 30 % (ref 28–44)
Height: 61 in
IVS/LV PW RATIO, ED: 1
LA ID, A-P, ES: 38 mm
LA diam end sys: 38 mm
LA diam index: 1.89 cm/m2
LA vol A4C: 36.5 ml
LA vol index: 21 mL/m2
LA vol: 42.3 mL
LV PW d: 12 mm — AB (ref 0.6–1.1)
LV dias vol index: 32 mL/m2
LV dias vol: 65 mL (ref 46–106)
LV e' LATERAL: 3.97 cm/s
LV sys vol index: 14 mL/m2
LV sys vol: 28 mL (ref 14–42)
LVOT area: 2.54 cm2
LVOT diameter: 18 mm
Lateral S' vel: 10.6 cm/s
MV Dec: 201
MV pk E vel: 1 m/s
Simpson's disk: 57
Stroke v: 37 ml
TAPSE: 16 mm
TDI e' lateral: 3.97
TDI e' medial: 6.53
Weight: 3161.61 oz

## 2017-04-03 LAB — MRSA PCR SCREENING: MRSA by PCR: NEGATIVE

## 2017-04-03 LAB — URINALYSIS, ROUTINE W REFLEX MICROSCOPIC
Bilirubin Urine: NEGATIVE
Glucose, UA: NEGATIVE mg/dL
Ketones, ur: NEGATIVE mg/dL
Leukocytes, UA: NEGATIVE
Nitrite: NEGATIVE
Protein, ur: NEGATIVE mg/dL
Specific Gravity, Urine: 1.008 (ref 1.005–1.030)
pH: 6 (ref 5.0–8.0)

## 2017-04-03 LAB — BASIC METABOLIC PANEL
Anion gap: 10 (ref 5–15)
BUN: 9 mg/dL (ref 6–20)
CO2: 26 mmol/L (ref 22–32)
Calcium: 9 mg/dL (ref 8.9–10.3)
Chloride: 101 mmol/L (ref 101–111)
Creatinine, Ser: 1.01 mg/dL — ABNORMAL HIGH (ref 0.44–1.00)
GFR calc Af Amer: >60 mL/min (ref >60)
GFR calc non Af Amer: 60 mL/min — ABNORMAL LOW (ref >60)
Glucose, Bld: 124 mg/dL — ABNORMAL HIGH (ref 65–99)
Potassium: 2.9 mmol/L — ABNORMAL LOW (ref 3.5–5.1)
Sodium: 137 mmol/L (ref 135–145)

## 2017-04-03 LAB — CBC WITH DIFFERENTIAL/PLATELET
Basophils Absolute: 0.1 10*3/uL (ref 0.0–0.1)
Basophils Relative: 1 %
Eosinophils Absolute: 0.1 10*3/uL (ref 0.0–0.7)
Eosinophils Relative: 2 %
HCT: 42.2 % (ref 36.0–46.0)
Hemoglobin: 14.4 g/dL (ref 12.0–15.0)
Lymphocytes Relative: 31 %
Lymphs Abs: 2.3 10*3/uL (ref 0.7–4.0)
MCH: 34.7 pg — ABNORMAL HIGH (ref 26.0–34.0)
MCHC: 34.1 g/dL (ref 30.0–36.0)
MCV: 101.7 fL — ABNORMAL HIGH (ref 78.0–100.0)
Monocytes Absolute: 0.5 10*3/uL (ref 0.1–1.0)
Monocytes Relative: 7 %
Neutro Abs: 4.4 10*3/uL (ref 1.7–7.7)
Neutrophils Relative %: 59 %
Platelets: 178 10*3/uL (ref 150–400)
RBC: 4.15 MIL/uL (ref 3.87–5.11)
RDW: 13 % (ref 11.5–15.5)
WBC: 7.3 10*3/uL (ref 4.0–10.5)

## 2017-04-03 LAB — LIPID PANEL
Cholesterol: 205 mg/dL — ABNORMAL HIGH (ref 0–200)
HDL: 50 mg/dL (ref >40)
LDL Cholesterol: 137 mg/dL — ABNORMAL HIGH (ref 0–99)
Total CHOL/HDL Ratio: 4.1 RATIO
Triglycerides: 89 mg/dL (ref <150)
VLDL: 18 mg/dL (ref 0–40)

## 2017-04-03 LAB — HIV ANTIBODY (ROUTINE TESTING W REFLEX): HIV Screen 4th Generation wRfx: NONREACTIVE

## 2017-04-03 LAB — RAPID URINE DRUG SCREEN, HOSP PERFORMED
Amphetamines: NOT DETECTED
Barbiturates: NOT DETECTED
Benzodiazepines: NOT DETECTED
Cocaine: NOT DETECTED
Opiates: POSITIVE — AB
Tetrahydrocannabinol: NOT DETECTED

## 2017-04-03 LAB — BRAIN NATRIURETIC PEPTIDE: B Natriuretic Peptide: 110 pg/mL — ABNORMAL HIGH (ref 0.0–100.0)

## 2017-04-03 LAB — GLUCOSE, CAPILLARY
Glucose-Capillary: 100 mg/dL — ABNORMAL HIGH (ref 65–99)
Glucose-Capillary: 105 mg/dL — ABNORMAL HIGH (ref 65–99)
Glucose-Capillary: 78 mg/dL (ref 65–99)
Glucose-Capillary: 81 mg/dL (ref 65–99)

## 2017-04-03 LAB — TSH: TSH: 8.806 u[IU]/mL — ABNORMAL HIGH (ref 0.350–4.500)

## 2017-04-03 LAB — TROPONIN I
Troponin I: 0.03 ng/mL (ref <0.03)
Troponin I: 0.04 ng/mL (ref <0.03)
Troponin I: 0.03 ng/mL (ref <0.03)

## 2017-04-03 LAB — MAGNESIUM: Magnesium: 2.1 mg/dL (ref 1.7–2.4)

## 2017-04-03 MED ORDER — PNEUMOCOCCAL VAC POLYVALENT 25 MCG/0.5ML IJ INJ
0.5000 mL | INJECTION | INTRAMUSCULAR | Status: DC
  Filled 2017-04-03: qty 0.5

## 2017-04-03 MED ORDER — POTASSIUM CHLORIDE CRYS ER 20 MEQ PO TBCR
30.0000 meq | EXTENDED_RELEASE_TABLET | Freq: Two times a day (BID) | ORAL | Status: AC
  Administered 2017-04-03 (×2): 30 meq via ORAL
  Filled 2017-04-03 (×2): qty 1

## 2017-04-03 NOTE — Progress Notes (Signed)
  Echocardiogram 2D Echocardiogram has been performed.  Kimberly Christian 04/03/2017, 2:45 PM

## 2017-04-03 NOTE — Progress Notes (Signed)
PROGRESS NOTE    SOPHYA Christian  IEP:[id number] DOB: [birth year] DOA: 04/02/2017 PCP: Lucille Passy, MD   Outpatient Specialists:     Brief Narrative:  Kimberly Christian is a 61 y.o. female with medical history significant for depression, hyperlipidemia, and tobacco abuse, now presenting to the emergency department for evaluation of intermittent headaches and elevated blood pressure. Patient reports experiencing mild intermittent frontal headaches for the past couple weeks, noted to be unusual for her, but responding well to ibuprofen at home. She then developed some pressure sensation in the central chest this morning which lasted approximately 2 hours, was described as mild to moderate in intensity, localized to the central chest, not associated with diaphoresis, nausea, or shortness of breath, and with no alleviating or exacerbating factors identified. This resolved spontaneously. She decided to check her blood pressure today, found her systolic pressures to be in the 180s, and called her PCP for advice. PCP directed the patient to the emergency department for evaluation. [name] denies any palpitations, denies any fevers or chills, and denies any change in her vision, hearing, or focal numbness or weakness. Denies any use of alcohol or illicit substances. She does not take any prescription medications. Headache is described as mild to moderate in intensity, dull and aching in character, localized to the frontal region, alleviated with ibuprofen, and with no exacerbating factors identified.   Assessment & Plan:   Principal Problem:   Chest pain Active Problems:   TOBACCO ABUSE   HLD (hyperlipidemia)   Hyperglycemia   Depression   Hypertensive urgency   Incidental lung nodule   Chest pressure - cards consulted -plan echo today and if ok then NPO after midnight for stress test in AM -ASA  Elevated TSH -check free t4 in AM  Hypertensive urgency  - Pt denies hx of HTN, noted to  have pressures in 220/120 range in ED  -  Norvasc 10 mg   Incidental lung nodule - on CT scan found to be shadow - CXR notable for ~3 cm nodule posterior to trachea on lateral film -- shadow  Depression  - Stable, well-controlled with Celexa, will continue   Current smoker  - Smoking ~ppd, had not really been eager to quit, but with newly identified lung nodule she wants to stop  - RN to provide smoking cessation information  - Nicotine replacement prn    Hyperglycemia  - Serum glucose 188 on admission - SSI   DVT prophylaxis:  Lovenox   Code Status: Full Code   Family Communication:   Disposition Plan:     Consultants:  cards  Subjective: Getting echo No current complaints  Objective: Vitals:   04/03/17 0600 04/03/17 0654 04/03/17 0742 04/03/17 1131  BP: 112/64 137/63    Pulse:  66 67 69  Resp:  11 15 16   Temp:  98.6 F (37 C) 98.2 F (36.8 C) 98.6 F (37 C)  TempSrc:  Oral Oral Oral  SpO2:  96% 97% 98%  Weight:  89.6 kg (197 lb 9.6 oz)    Height:        Intake/Output Summary (Last 24 hours) at 04/03/17 1429 Last data filed at 04/03/17 1307  Gross per 24 hour  Intake                0 ml  Output              300 ml  Net             -  300 ml   Filed Weights   04/02/17 1135 04/02/17 2355 04/03/17 0654  Weight: 88.5 kg (195 lb) 89.6 kg (197 lb 9.6 oz) 89.6 kg (197 lb 9.6 oz)    Examination:  General exam: Appears calm and comfortable  Respiratory system: Clear to auscultation. Respiratory effort normal. Cardiovascular system: S1 & S2 heard, RRR. No JVD, murmurs, rubs, gallops or clicks. No pedal edema. Gastrointestinal system: Abdomen is nondistended, soft and nontender. No organomegaly or masses felt. Normal bowel sounds heard. Central nervous system: Alert and oriented. No focal neurological deficits. Extremities: Symmetric 5 x 5 power. Skin: No rashes, lesions or ulcers Psychiatry: Judgement and insight appear normal. Mood & affect  appropriate.     Data Reviewed: I have personally reviewed following labs and imaging studies  CBC:  Recent Labs Lab 04/02/17 1139 04/03/17 0304  WBC 7.8 7.3  NEUTROABS  --  4.4  HGB 16.1* 14.4  HCT 46.8* 42.2  MCV 99.8 101.7*  PLT 193 283   Basic Metabolic Panel:  Recent Labs Lab 04/02/17 1139 04/03/17 0304 04/03/17 1248  NA 134* 137  --   K 3.8 2.9*  --   CL 99* 101  --   CO2 24 26  --   GLUCOSE 188* 124*  --   BUN 7 9  --   CREATININE 0.94 1.01*  --   CALCIUM 9.0 9.0  --   MG  --   --  2.1   GFR: Estimated Creatinine Clearance: 61.1 mL/min (A) (by C-G formula based on SCr of 1.01 mg/dL (H)). Liver Function Tests: No results for input(s): AST, ALT, ALKPHOS, BILITOT, PROT, ALBUMIN in the last 168 hours. No results for input(s): LIPASE, AMYLASE in the last 168 hours. No results for input(s): AMMONIA in the last 168 hours. Coagulation Profile: No results for input(s): INR, PROTIME in the last 168 hours. Cardiac Enzymes:  Recent Labs Lab 04/02/17 2358 04/03/17 0304 04/03/17 0833  TROPONINI 0.03* 0.04* 0.03*   BNP (last 3 results) No results for input(s): PROBNP in the last 8760 hours. HbA1C: No results for input(s): HGBA1C in the last 72 hours. CBG:  Recent Labs Lab 04/03/17 0731 04/03/17 1104  GLUCAP 100* 105*   Lipid Profile:  Recent Labs  04/03/17 0833  CHOL 205*  HDL 50  LDLCALC 137*  TRIG 89  CHOLHDL 4.1   Thyroid Function Tests:  Recent Labs  04/02/17 2358  TSH 8.806*   Anemia Panel: No results for input(s): VITAMINB12, FOLATE, FERRITIN, TIBC, IRON, RETICCTPCT in the last 72 hours. Urine analysis: No results found for: COLORURINE, APPEARANCEUR, LABSPEC, [place], GLUCOSEU, HGBUR, BILIRUBINUR, KETONESUR, PROTEINUR, UROBILINOGEN, NITRITE, LEUKOCYTESUR Sepsis Labs: @LABRCNTIP (procalcitonin:4,lacticidven:4)  ) Recent Results (from the past 240 hour(s))  MRSA PCR Screening     Status: None   Collection Time: 04/02/17 11:57 PM    Result Value Ref Range Status   MRSA by PCR NEGATIVE NEGATIVE Final    Comment:        The GeneXpert MRSA Assay (FDA approved for NASAL specimens only), is one component of a comprehensive MRSA colonization surveillance program. It is not intended to diagnose MRSA infection nor to guide or monitor treatment for MRSA infections.       Anti-infectives    None       Radiology Studies: Dg Chest 2 View  Result Date: 04/02/2017 CLINICAL DATA:  Chest pressure. EXAM: CHEST  2 VIEW COMPARISON:  None. FINDINGS: Generalized interstitial coarsening, favored bronchitic. There is no vascular pedicle widening or  Kerley lines. No pleural effusion or cardiomegaly. No air bronchograms. There is an ovoid opacity projecting posterior to the trachea in the lateral projection, nearly 3 cm in length. No acute osseous finding. IMPRESSION: 1. Unexpected nodular density in the lateral view, recommend chest CT in this smoker. 2. Interstitial coarsening, likely bronchitic. Electronically Signed   By: Monte Fantasia M.D.   On: 04/02/2017 12:27   Ct Chest Wo Contrast  Result Date: 04/02/2017 CLINICAL DATA:  Initial evaluation for incidental lung nodule, chest pressure. Smoker. EXAM: CT CHEST WITHOUT CONTRAST TECHNIQUE: Multidetector CT imaging of the chest was performed following the standard protocol without IV contrast. COMPARISON:  Prior radiograph from earlier same day. FINDINGS: Cardiovascular: Intrathoracic aorta of normal caliber without aneurysm. Mild atheromatous plaque within the aortic arch and descending intrathoracic aorta. Visualized great vessels within normal limits. Heart size within normal limits. No pericardial effusion. Mediastinum/Nodes: Visualized thyroid is normal. Mildly enlarged right paratracheal node measuring 11 mm positioned just above the carina (series 4, image 46). Additional mildly prominent 13 mm subcarinal lymph node (Series 4, image 60). No other pathologically enlarged  mediastinal, hilar, or axillary lymph nodes identified. Esophagus within normal limits. Lungs/Pleura: Tracheobronchial tree is patent. Mild scattered peribronchial thickening, suspected to be related to history of smoking. Hazy subsegmental atelectatic changes present dependently within the lower lobes bilaterally. No focal infiltrates. Mild pulmonary interstitial congestion without overt pulmonary edema. No pleural effusion. No pneumothorax. No worrisome pulmonary nodule or mass. No findings to correspond with previously noted abnormality seen on prior chest x-ray. Upper Abdomen: Hepatic steatosis. Remainder the visualized upper abdomen otherwise within normal limits. Musculoskeletal: 2 cm cystic lesion noted within the subcutaneous fat of the left back, likely a small sebaceous cyst. External soft tissues otherwise unremarkable. No acute osseous abnormality. No worrisome lytic or blastic osseous lesions. IMPRESSION: 1. No pulmonary nodule or mass identified. No findings to correlate with previously noted nodular density on prior x-ray. This may have been related to summation of shadows. 2. Few mildly enlarged mediastinal lymph nodes as above, indeterminate, but may be reactive. 3. Scattered peribronchial thickening, suspected to be related to history of smoking. 4. Mild pulmonary interstitial congestion with bibasilar subsegmental atelectasis. No other active cardiopulmonary disease. 5. Hepatic steatosis. 6. Mild aortic atherosclerosis. Electronically Signed   By: Jeannine Boga M.D.   On: 04/02/2017 23:52        Scheduled Meds: . aspirin EC  81 mg Oral Daily  . citalopram  40 mg Oral Daily  . enoxaparin (LOVENOX) injection  40 mg Subcutaneous Q24H  . insulin aspart  0-5 Units Subcutaneous QHS  . insulin aspart  0-9 Units Subcutaneous TID WC  . [START ON 04/04/2017] pneumococcal 23 valent vaccine  0.5 mL Intramuscular Tomorrow-1000  . potassium chloride  30 mEq Oral BID  . sodium chloride flush   3 mL Intravenous Q12H   Continuous Infusions: . sodium chloride       LOS: 0 days    Time spent: 25 min    [place], DO [facility] Pager [phone]  If 7PM-7AM, please contact night-coverage [email or web address] Password [email or web address] 04/03/2017, 2:29 PM

## 2017-04-03 NOTE — Progress Notes (Addendum)
[facility] paged regarding critical result troponin 0.03.   No new orders received, will wait to see results of 0600 troponin.

## 2017-04-03 NOTE — Plan of Care (Signed)
Problem: Safety: Goal: Ability to remain free from injury will improve Outcome: Progressing Patient has remained free from injury. Call light within reach. Up to restroom.   Problem: Pain Managment: Goal: General experience of comfort will improve Outcome: Progressing Norco given x1 PRN, relief noted per patient.   Problem: Physical Regulation: Goal: Ability to maintain clinical measurements within normal limits will improve Outcome: Progressing Norvasc PO 10 mg given along with ASA and Norco upon admission to floor. Blood pressure has trended down. No labetalol needed since arrival to floor.

## 2017-04-03 NOTE — Consult Note (Signed)
Cardiology Consult    Patient ID: Kimberly Christian MRN: [id number], DOB/AGE: [birth year]   Admit date: 04/02/2017 Date of Consult: 04/03/2017  Primary Physician: Lucille Passy, MD Primary Cardiologist: new - Dr. Radford Pax Requesting Provider: Dr. Eliseo Squires  Reason for Consult: chest pain  Patient Profile    Kimberly Christian has a PMH significant for HLD, not on statin due to intolerance, depression, and allergies. She presented to [facility] with a 2-week history of headache and 1-day history of associated hypertension and chest pain.  Kimberly Christian is a 61 y.o. female who is being seen today for the evaluation of chest pain at the request of Dr. Eliseo Squires.   Past Medical History   Past Medical History:  Diagnosis Date  . Allergy   . Depression   . Hyperlipidemia     Past Surgical History:  Procedure Laterality Date  . TUBAL LIGATION    . WISDOM TOOTH EXTRACTION       Allergies  No Known Allergies  History of Present Illness    Kimberly Christian presented to [facility] with a 2-week history of headache that was relieved with ibuprofen. Yesterday, she again had headache, but was associated with new-onset chest pain in her left chest. The pain was rated as a 5/10, did not radiate anywhere, and was not associated with activity. She took her BP at home and reported it was 200/100s, which prompted her to present to the ED. On arrival, she received 2 doses of IV labetalol for pressure control. She states this relieved her headache and chest pain. On my interview, she has no current chest pain or headache and her pressure is 120s. She denies nausea, vomiting, palpitations, shortness of breath, dizziness, and feelings of syncope.  Inpatient Medications    . aspirin EC  81 mg Oral Daily  . citalopram  40 mg Oral Daily  . enoxaparin (LOVENOX) injection  40 mg Subcutaneous Q24H  . insulin aspart  0-5 Units Subcutaneous QHS  . insulin aspart  0-9 Units Subcutaneous TID WC  . [START ON 04/04/2017] pneumococcal 23 valent  vaccine  0.5 mL Intramuscular Tomorrow-1000  . potassium chloride  30 mEq Oral BID  . sodium chloride flush  3 mL Intravenous Q12H     Outpatient Medications    Prior to Admission medications   Medication Sig Start Date End Date Taking? Authorizing Provider  citalopram (CELEXA) 40 MG tablet TAKE 1 BY MOUTH DAILY Patient taking differently: Take 40 mg by mouth daily.  12/13/16  Yes Lucille Passy, MD  ibuprofen (ADVIL,MOTRIN) 200 MG tablet Take 800 mg by mouth every 6 (six) hours as needed for mild pain.   Yes [provider]  chlorpheniramine-HYDROcodone (TUSSIONEX PENNKINETIC ER) 10-8 MG/5ML SUER Take 5 mLs by mouth every 12 (twelve) hours as needed. Patient not taking: Reported on 04/02/2017 07/12/16   Lucille Passy, MD  simvastatin (ZOCOR) 10 MG tablet TAKE 1 TABLET (10 MG TOTAL) BY MOUTH AT BEDTIME. Patient not taking: Reported on 07/12/2016 04/16/14   Lucille Passy, MD     Family History    Family History  Problem Relation Age of Onset  . Cancer Mother        lung  . Pulmonary fibrosis Father     Social History    Social History   Social History  . Marital status: Single    Spouse name: N/A  . Number of children: N/A  . Years of education: N/A   Occupational History  .  Not on file.   Social History Main Topics  . Smoking status: Current Every Day Smoker    Packs/day: 1.00    Years: 20.00    Types: Cigarettes  . Smokeless tobacco: Never Used  . Alcohol use 4.8 - 6.0 oz/week    8 - 10 Cans of beer per week     Comment: Occasional  . Drug use: No  . Sexual activity: Not on file   Other Topics Concern  . Not on file   Social History Narrative  . No narrative on file     Review of Systems    General:  No chills, fever, night sweats or weight changes.  Cardiovascular:  No chest pain, dyspnea on exertion, edema, orthopnea, palpitations, paroxysmal nocturnal dyspnea. Dermatological: No rash, lesions/masses Respiratory: No cough, dyspnea Urologic: No  hematuria, dysuria Abdominal:   No nausea, vomiting, diarrhea, bright red blood per rectum, melena, or hematemesis Neurologic:  No visual changes, wkns, changes in mental status. All other systems reviewed and are otherwise negative except as noted above.  Physical Exam    Blood pressure 137/63, pulse 67, temperature 98.2 F (36.8 C), temperature source Oral, resp. rate 15, height 5\' 1"  (1.549 m), weight 197 lb 9.6 oz (89.6 kg), SpO2 97 %.  General: Pleasant, NAD Psych: Normal affect. Neuro: Alert and oriented X 3. Moves all extremities spontaneously. HEENT: Normal  Neck: Supple without bruits or JVD. Lungs:  Resp regular and unlabored, CTA. Heart: RRR no s3, s4, or murmurs. Abdomen: Soft, non-tender, non-distended, BS + x 4.  Extremities: No clubbing, cyanosis or edema. DP/PT/Radials 2+ and equal bilaterally.  Labs    Troponin [facility] of Care Test)  Recent Labs  04/02/17 2126  TROPIPOC 0.01    Recent Labs  04/02/17 2358 04/03/17 0304  TROPONINI 0.03* 0.04*   Lab Results  Component Value Date   WBC 7.3 04/03/2017   HGB 14.4 04/03/2017   HCT 42.2 04/03/2017   MCV 101.7 (H) 04/03/2017   PLT 178 04/03/2017    Recent Labs Lab 04/03/17 0304  NA 137  K 2.9*  CL 101  CO2 26  BUN 9  CREATININE 1.01*  CALCIUM 9.0  GLUCOSE 124*   Lab Results  Component Value Date   CHOL 189 04/09/2014   HDL 46.20 04/09/2014   LDLCALC 126 (H) 04/09/2014   TRIG 83.0 04/09/2014   No results found for: [facility]   Radiology Studies    Dg Chest 2 View  Result Date: 04/02/2017 CLINICAL DATA:  Chest pressure. EXAM: CHEST  2 VIEW COMPARISON:  None. FINDINGS: Generalized interstitial coarsening, favored bronchitic. There is no vascular pedicle widening or Kerley lines. No pleural effusion or cardiomegaly. No air bronchograms. There is an ovoid opacity projecting posterior to the trachea in the lateral projection, nearly 3 cm in length. No acute osseous finding. IMPRESSION: 1. Unexpected  nodular density in the lateral view, recommend chest CT in this smoker. 2. Interstitial coarsening, likely bronchitic. Electronically Signed   By: Monte Fantasia M.D.   On: 04/02/2017 12:27   Ct Chest Wo Contrast  Result Date: 04/02/2017 CLINICAL DATA:  Initial evaluation for incidental lung nodule, chest pressure. Smoker. EXAM: CT CHEST WITHOUT CONTRAST TECHNIQUE: Multidetector CT imaging of the chest was performed following the standard protocol without IV contrast. COMPARISON:  Prior radiograph from earlier same day. FINDINGS: Cardiovascular: Intrathoracic aorta of normal caliber without aneurysm. Mild atheromatous plaque within the aortic arch and descending intrathoracic aorta. Visualized great vessels within normal limits. Heart  size within normal limits. No pericardial effusion. Mediastinum/Nodes: Visualized thyroid is normal. Mildly enlarged right paratracheal node measuring 11 mm positioned just above the carina (series 4, image 46). Additional mildly prominent 13 mm subcarinal lymph node (Series 4, image 60). No other pathologically enlarged mediastinal, hilar, or axillary lymph nodes identified. Esophagus within normal limits. Lungs/Pleura: Tracheobronchial tree is patent. Mild scattered peribronchial thickening, suspected to be related to history of smoking. Hazy subsegmental atelectatic changes present dependently within the lower lobes bilaterally. No focal infiltrates. Mild pulmonary interstitial congestion without overt pulmonary edema. No pleural effusion. No pneumothorax. No worrisome pulmonary nodule or mass. No findings to correspond with previously noted abnormality seen on prior chest x-ray. Upper Abdomen: Hepatic steatosis. Remainder the visualized upper abdomen otherwise within normal limits. Musculoskeletal: 2 cm cystic lesion noted within the subcutaneous fat of the left back, likely a small sebaceous cyst. External soft tissues otherwise unremarkable. No acute osseous abnormality. No  worrisome lytic or blastic osseous lesions. IMPRESSION: 1. No pulmonary nodule or mass identified. No findings to correlate with previously noted nodular density on prior x-ray. This may have been related to summation of shadows. 2. Few mildly enlarged mediastinal lymph nodes as above, indeterminate, but may be reactive. 3. Scattered peribronchial thickening, suspected to be related to history of smoking. 4. Mild pulmonary interstitial congestion with bibasilar subsegmental atelectasis. No other active cardiopulmonary disease. 5. Hepatic steatosis. 6. Mild aortic atherosclerosis. Electronically Signed   By: Jeannine Boga M.D.   On: 04/02/2017 23:52    ECG & Cardiac Imaging    EKG 04/02/17: NSR, inverted T waves in precordial leads, flattened T waves in I, II, III, no previous for comparison  Echocardiogram 04/03/17: pending  Assessment & Plan    1. Chest pain, headache, HTN urgency vs emergency - troponin 0.01 --> 0.03 --> 0.04 - EKG with nonspecific ST changes - pt presented with 2-week history of headache. Home BP check showed BP in the 190s/90s - on arrival to ED, pressures were 200/100s and treated with IV labetalol x 2 - mildly elevated troponin likely demand ischemia in the setting of hypertensive urgency - BP now better controlled - 137/63, 112/64 - BNP slightly elevated to 110 Her chest pain coincided with hypertensive urgency vs emergency and is atypical in nature. She has risk factors for ACS, including previously diagnosed HLD, obesity, and current smoker. However, her chest pain is likely related to her hypertensive urgency vs emergency. Will ultimately need nuclear stress test due to CRFs.   2. HLD - pt discontinued statin over a year ago and takes no medication for HLD - repeat lipid panel here for baseline   3. Elevated TSH - TSH this admission 8.806   4. Hypokalemia - K 3.3 --> 2.9 - replace per primary team   5. Elevated sCr - 1.01 (0.94), likely related to  her hypertension   6. Current smoker - encourage smoking cessation  Signed, Ledora Bottcher, PA-C 04/03/2017, 9:09 AM [phone]   Patient seen and independently examined with Fabian Sharp, PA. We discussed all aspects of the encounter. I agree with the assessment and plan as stated above.  Patient admitted with hypertensive urgency after developing a headache for several days and then chest discomfort.  Trop minimally elevated with flat trend and EKG with nonspcific ST abnormality.  Exam shows lungs CTA, heart RRR with no M/R/G, abdomen soft with no bruits, no LE edema.  Her CP has resolved with normalization of BP and likely her trop bump  and CP were related to demand ischemia in the setting of hypertensive urgency.  She needs aggressive control her of BP.  Will plan 2D echo today and if EF is normal then make NPO after MN for nuclear stress test in am.   Signed: Fransico Him, MD [facility] 04/03/2017

## 2017-04-04 ENCOUNTER — Observation Stay (HOSPITAL_BASED_OUTPATIENT_CLINIC_OR_DEPARTMENT_OTHER): Payer: BLUE CROSS/BLUE SHIELD

## 2017-04-04 DIAGNOSIS — R778 Other specified abnormalities of plasma proteins: Secondary | ICD-10-CM

## 2017-04-04 DIAGNOSIS — R7989 Other specified abnormal findings of blood chemistry: Secondary | ICD-10-CM

## 2017-04-04 DIAGNOSIS — R079 Chest pain, unspecified: Secondary | ICD-10-CM

## 2017-04-04 DIAGNOSIS — E7800 Pure hypercholesterolemia, unspecified: Secondary | ICD-10-CM

## 2017-04-04 DIAGNOSIS — F17200 Nicotine dependence, unspecified, uncomplicated: Secondary | ICD-10-CM | POA: Diagnosis not present

## 2017-04-04 DIAGNOSIS — I10 Essential (primary) hypertension: Secondary | ICD-10-CM | POA: Diagnosis not present

## 2017-04-04 DIAGNOSIS — I160 Hypertensive urgency: Secondary | ICD-10-CM | POA: Diagnosis not present

## 2017-04-04 LAB — CBC
HCT: 44.2 % (ref 36.0–46.0)
Hemoglobin: 15.1 g/dL — ABNORMAL HIGH (ref 12.0–15.0)
MCH: 34.6 pg — ABNORMAL HIGH (ref 26.0–34.0)
MCHC: 34.2 g/dL (ref 30.0–36.0)
MCV: 101.1 fL — ABNORMAL HIGH (ref 78.0–100.0)
Platelets: 181 10*3/uL (ref 150–400)
RBC: 4.37 MIL/uL (ref 3.87–5.11)
RDW: 12.9 % (ref 11.5–15.5)
WBC: 6 10*3/uL (ref 4.0–10.5)

## 2017-04-04 LAB — GLUCOSE, CAPILLARY
Glucose-Capillary: 153 mg/dL — ABNORMAL HIGH (ref 65–99)
Glucose-Capillary: 116 mg/dL — ABNORMAL HIGH (ref 65–99)
Glucose-Capillary: 180 mg/dL — ABNORMAL HIGH (ref 65–99)
Glucose-Capillary: 77 mg/dL (ref 65–99)

## 2017-04-04 LAB — NM MYOCAR MULTI W/SPECT W/WALL MOTION / EF
Estimated workload: 1 METS
Exercise duration (min): 5 min
MPHR: 161 {beats}/min
Peak HR: 129 {beats}/min
Percent HR: 80 %
Rest HR: 80 {beats}/min

## 2017-04-04 LAB — BASIC METABOLIC PANEL
Anion gap: 9 (ref 5–15)
BUN: 8 mg/dL (ref 6–20)
CO2: 25 mmol/L (ref 22–32)
Calcium: 8.9 mg/dL (ref 8.9–10.3)
Chloride: 105 mmol/L (ref 101–111)
Creatinine, Ser: 0.72 mg/dL (ref 0.44–1.00)
GFR calc Af Amer: >60 mL/min (ref >60)
GFR calc non Af Amer: >60 mL/min (ref >60)
Glucose, Bld: 116 mg/dL — ABNORMAL HIGH (ref 65–99)
Potassium: 3.2 mmol/L — ABNORMAL LOW (ref 3.5–5.1)
Sodium: 139 mmol/L (ref 135–145)

## 2017-04-04 LAB — T4, FREE: Free T4: 0.79 ng/dL (ref 0.61–1.12)

## 2017-04-04 MED ORDER — METOCLOPRAMIDE HCL 5 MG/ML IJ SOLN
10.0000 mg | Freq: Once | INTRAMUSCULAR | Status: AC
  Administered 2017-04-04: 10 mg via INTRAVENOUS
  Filled 2017-04-04: qty 2

## 2017-04-04 MED ORDER — HYDRALAZINE HCL 20 MG/ML IJ SOLN
10.0000 mg | Freq: Once | INTRAMUSCULAR | Status: AC
  Administered 2017-04-04: 10 mg via INTRAVENOUS
  Filled 2017-04-04: qty 1

## 2017-04-04 MED ORDER — HYDRALAZINE HCL 10 MG PO TABS: 10.0000 mg | ORAL_TABLET | Freq: Three times a day (TID) | ORAL | Status: DC

## 2017-04-04 MED ORDER — ASPIRIN-ACETAMINOPHEN-CAFFEINE 250-250-65 MG PO TABS
1.0000 | ORAL_TABLET | Freq: Once | ORAL | Status: AC
  Administered 2017-04-04: 1 via ORAL
  Filled 2017-04-04: qty 1

## 2017-04-04 MED ORDER — REGADENOSON 0.4 MG/5ML IV SOLN
0.4000 mg | Freq: Once | INTRAVENOUS | Status: AC
  Administered 2017-04-04: 0.4 mg via INTRAVENOUS
  Filled 2017-04-04: qty 5

## 2017-04-04 MED ORDER — POTASSIUM CHLORIDE CRYS ER 20 MEQ PO TBCR
40.0000 meq | EXTENDED_RELEASE_TABLET | Freq: Once | ORAL | Status: AC
  Administered 2017-04-04: 40 meq via ORAL
  Filled 2017-04-04: qty 2

## 2017-04-04 MED ORDER — CARVEDILOL 6.25 MG PO TABS
6.2500 mg | ORAL_TABLET | Freq: Two times a day (BID) | ORAL | Status: DC
  Administered 2017-04-04 – 2017-04-05 (×2): 6.25 mg via ORAL
  Filled 2017-04-04 (×2): qty 1

## 2017-04-04 MED ORDER — AMLODIPINE BESYLATE 10 MG PO TABS
10.0000 mg | ORAL_TABLET | Freq: Every day | ORAL | Status: DC
  Administered 2017-04-04 – 2017-04-05 (×2): 10 mg via ORAL
  Filled 2017-04-04 (×2): qty 1

## 2017-04-04 MED ORDER — REGADENOSON 0.4 MG/5ML IV SOLN
INTRAVENOUS | Status: AC
  Administered 2017-04-04: 0.4 mg via INTRAVENOUS
  Filled 2017-04-04: qty 5

## 2017-04-04 MED ORDER — TECHNETIUM TC 99M TETROFOSMIN IV KIT: 30.0000 | PACK | Freq: Once | INTRAVENOUS | Status: DC | PRN

## 2017-04-04 MED ORDER — TECHNETIUM TC 99M TETROFOSMIN IV KIT
10.0000 | PACK | Freq: Once | INTRAVENOUS | Status: AC | PRN
  Administered 2017-04-04: 10 via INTRAVENOUS

## 2017-04-04 MED ORDER — LABETALOL HCL 5 MG/ML IV SOLN
10.0000 mg | Freq: Once | INTRAVENOUS | Status: AC
  Administered 2017-04-04: 10 mg via INTRAVENOUS
  Filled 2017-04-04: qty 4

## 2017-04-04 MED ORDER — KETOROLAC TROMETHAMINE 30 MG/ML IJ SOLN
30.0000 mg | Freq: Once | INTRAMUSCULAR | Status: AC
  Administered 2017-04-04: 30 mg via INTRAVENOUS
  Filled 2017-04-04: qty 1

## 2017-04-04 MED ORDER — DIPHENHYDRAMINE HCL 50 MG/ML IJ SOLN
25.0000 mg | Freq: Once | INTRAMUSCULAR | Status: AC
  Administered 2017-04-04: 25 mg via INTRAVENOUS
  Filled 2017-04-04: qty 1

## 2017-04-04 NOTE — Progress Notes (Signed)
PROGRESS NOTE    Kimberly Christian  HYQ:[id number] DOB: 11-30-1955 DOA: 04/02/2017 PCP: Lucille Passy, MD   Outpatient Specialists:     Brief Narrative:  Kimberly Christian is a 61 y.o. female with medical history significant for depression, hyperlipidemia, and tobacco abuse, now presenting to the emergency department for evaluation of intermittent headaches and elevated blood pressure. Patient reports experiencing mild intermittent frontal headaches for the past couple weeks, noted to be unusual for her, but responding well to ibuprofen at home. She then developed some pressure sensation in the central chest this morning which lasted approximately 2 hours, was described as mild to moderate in intensity, localized to the central chest, not associated with diaphoresis, nausea, or shortness of breath, and with no alleviating or exacerbating factors identified. This resolved spontaneously. She decided to check her blood pressure today, found her systolic pressures to be in the 180s, and called her PCP for advice. PCP directed the patient to the emergency department for evaluation. [name] denies any palpitations, denies any fevers or chills, and denies any change in her vision, hearing, or focal numbness or weakness. Denies any use of alcohol or illicit substances. She does not take any prescription medications. Headache is described as mild to moderate in intensity, dull and aching in character, localized to the frontal region, alleviated with ibuprofen, and with no exacerbating factors identified.   Assessment & Plan:   Principal Problem:   Chest pain Active Problems:   TOBACCO ABUSE   HLD (hyperlipidemia)   Hyperglycemia   Depression   Hypertensive urgency   Elevated troponin   Chest pressure - cards consulted -echo resulted with grade 2 diastolic CHF- stress test pending today -ASA -suspect related to uncontrolled blood pressure  Elevated TSH -free t4 normal -?sick euthyroid-- follow  as an outpateint  Hypertensive urgency  - Pt denies hx of HTN, noted to have pressures in 220/120 range in ED  -  Norvasc 10 mg with addition of coreg today  Incidental lung nodule - on CT scan found to be shadow - CXR notable for ~3 cm nodule posterior to trachea on lateral film -- NOT found on CT Scan  Depression  - Stable, well-controlled with Celexa, will continue   Current smoker  - RN to provide smoking cessation information  - Nicotine replacement prn    Hyperglycemia  - Serum glucose 188 on admission - SSI  Hypokalemia -? Etiology -renin/aldosterone ordered  DVT prophylaxis:  Lovenox   Code Status: Full Code   Family Communication:   Disposition Plan:     Consultants:  cards  Subjective: Had a headache this AM  Objective: Vitals:   04/04/17 1407 04/04/17 1408 04/04/17 1410 04/04/17 1412  BP:  107/80 130/87 (!) 173/87  Pulse: 79     Resp:      Temp:      TempSrc:      SpO2:      Weight:      Height:        Intake/Output Summary (Last 24 hours) at 04/04/17 1539 Last data filed at 04/04/17 0458  Gross per 24 hour  Intake              250 ml  Output             1000 ml  Net             -750 ml   Filed Weights   04/02/17 2355 04/03/17 0654 04/04/17 0453  Weight: 89.6  kg (197 lb 9.6 oz) 89.6 kg (197 lb 9.6 oz) 88.1 kg (194 lb 3.2 oz)    Examination:  General exam: mildly uncomfotable Respiratory system: no wheezing Cardiovascular system: rrr Gastrointestinal system: +BS, soft Central nervous system: Alert and oriented. No focal neurological deficits.      Data Reviewed: I have personally reviewed following labs and imaging studies  CBC:  Recent Labs Lab 04/02/17 1139 04/03/17 0304 04/04/17 0340  WBC 7.8 7.3 6.0  NEUTROABS  --  4.4  --   HGB 16.1* 14.4 15.1*  HCT 46.8* 42.2 44.2  MCV 99.8 101.7* 101.1*  PLT 193 178 449   Basic Metabolic Panel:  Recent Labs Lab 04/02/17 1139 04/03/17 0304 04/03/17 1248  04/04/17 0340  NA 134* 137  --  139  K 3.8 2.9*  --  3.2*  CL 99* 101  --  105  CO2 24 26  --  25  GLUCOSE 188* 124*  --  116*  BUN 7 9  --  8  CREATININE 0.94 1.01*  --  0.72  CALCIUM 9.0 9.0  --  8.9  MG  --   --  2.1  --    GFR: Estimated Creatinine Clearance: 76.4 mL/min (by C-G formula based on SCr of 0.72 mg/dL). Liver Function Tests: No results for input(s): AST, ALT, ALKPHOS, BILITOT, PROT, ALBUMIN in the last 168 hours. No results for input(s): LIPASE, AMYLASE in the last 168 hours. No results for input(s): AMMONIA in the last 168 hours. Coagulation Profile: No results for input(s): INR, PROTIME in the last 168 hours. Cardiac Enzymes:  Recent Labs Lab 04/02/17 2358 04/03/17 0304 04/03/17 0833  TROPONINI 0.03* 0.04* 0.03*   BNP (last 3 results) No results for input(s): PROBNP in the last 8760 hours. HbA1C: No results for input(s): HGBA1C in the last 72 hours. CBG:  Recent Labs Lab 04/03/17 1104 04/03/17 1630 04/03/17 2114 04/04/17 0807 04/04/17 1112  GLUCAP 105* 78 81 153* 116*   Lipid Profile:  Recent Labs  04/03/17 0833  CHOL 205*  HDL 50  LDLCALC 137*  TRIG 89  CHOLHDL 4.1   Thyroid Function Tests:  Recent Labs  04/02/17 2358 04/04/17 0340  TSH 8.806*  --   FREET4  --  0.79   Anemia Panel: No results for input(s): VITAMINB12, FOLATE, FERRITIN, TIBC, IRON, RETICCTPCT in the last 72 hours. Urine analysis:    Component Value Date/Time   COLORURINE YELLOW 04/02/2017 1829   APPEARANCEUR CLEAR 04/02/2017 1829   LABSPEC 1.008 04/02/2017 1829   PHURINE 6.0 04/02/2017 1829   GLUCOSEU NEGATIVE 04/02/2017 1829   HGBUR SMALL (A) 04/02/2017 1829   BILIRUBINUR NEGATIVE 04/02/2017 [place] NEGATIVE 04/02/2017 [place] NEGATIVE 04/02/2017 1829   NITRITE NEGATIVE 04/02/2017 [place] NEGATIVE 04/02/2017 1829     ) Recent Results (from the past 240 hour(s))  MRSA PCR Screening     Status: None   Collection Time:  04/02/17 11:57 PM  Result Value Ref Range Status   MRSA by PCR NEGATIVE NEGATIVE Final    Comment:        The GeneXpert MRSA Assay (FDA approved for NASAL specimens only), is one component of a comprehensive MRSA colonization surveillance program. It is not intended to diagnose MRSA infection nor to guide or monitor treatment for MRSA infections.       Anti-infectives    None       Radiology Studies: Ct Chest Wo Contrast  Result Date:  04/02/2017 CLINICAL DATA:  Initial evaluation for incidental lung nodule, chest pressure. Smoker. EXAM: CT CHEST WITHOUT CONTRAST TECHNIQUE: Multidetector CT imaging of the chest was performed following the standard protocol without IV contrast. COMPARISON:  Prior radiograph from earlier same day. FINDINGS: Cardiovascular: Intrathoracic aorta of normal caliber without aneurysm. Mild atheromatous plaque within the aortic arch and descending intrathoracic aorta. Visualized great vessels within normal limits. Heart size within normal limits. No pericardial effusion. Mediastinum/Nodes: Visualized thyroid is normal. Mildly enlarged right paratracheal node measuring 11 mm positioned just above the carina (series 4, image 46). Additional mildly prominent 13 mm subcarinal lymph node (Series 4, image 60). No other pathologically enlarged mediastinal, hilar, or axillary lymph nodes identified. Esophagus within normal limits. Lungs/Pleura: Tracheobronchial tree is patent. Mild scattered peribronchial thickening, suspected to be related to history of smoking. Hazy subsegmental atelectatic changes present dependently within the lower lobes bilaterally. No focal infiltrates. Mild pulmonary interstitial congestion without overt pulmonary edema. No pleural effusion. No pneumothorax. No worrisome pulmonary nodule or mass. No findings to correspond with previously noted abnormality seen on prior chest x-ray. Upper Abdomen: Hepatic steatosis. Remainder the visualized upper  abdomen otherwise within normal limits. Musculoskeletal: 2 cm cystic lesion noted within the subcutaneous fat of the left back, likely a small sebaceous cyst. External soft tissues otherwise unremarkable. No acute osseous abnormality. No worrisome lytic or blastic osseous lesions. IMPRESSION: 1. No pulmonary nodule or mass identified. No findings to correlate with previously noted nodular density on prior x-ray. This may have been related to summation of shadows. 2. Few mildly enlarged mediastinal lymph nodes as above, indeterminate, but may be reactive. 3. Scattered peribronchial thickening, suspected to be related to history of smoking. 4. Mild pulmonary interstitial congestion with bibasilar subsegmental atelectasis. No other active cardiopulmonary disease. 5. Hepatic steatosis. 6. Mild aortic atherosclerosis. Electronically Signed   By: Jeannine Boga M.D.   On: 04/02/2017 23:52        Scheduled Meds: . amLODipine  10 mg Oral Daily  . aspirin EC  81 mg Oral Daily  . aspirin-acetaminophen-caffeine  1 tablet Oral Once  . carvedilol  6.25 mg Oral BID WC  . citalopram  40 mg Oral Daily  . enoxaparin (LOVENOX) injection  40 mg Subcutaneous Q24H  . insulin aspart  0-5 Units Subcutaneous QHS  . insulin aspart  0-9 Units Subcutaneous TID WC  . pneumococcal 23 valent vaccine  0.5 mL Intramuscular Tomorrow-1000  . sodium chloride flush  3 mL Intravenous Q12H   Continuous Infusions: . sodium chloride       LOS: 0 days    Time spent: 25 min    [place], DO [facility] Pager [phone]  If 7PM-7AM, please contact night-coverage [email or web address] Password [facility] 04/04/2017, 3:39 PM

## 2017-04-04 NOTE — Progress Notes (Addendum)
Progress Note  Patient Name: Kimberly Christian Date of Encounter: 04/04/2017  Primary Cardiologist: Dr. Radford Pax  Subjective   Denies any further CP or SOB  Inpatient Medications    Scheduled Meds: . amLODipine  10 mg Oral Daily  . aspirin EC  81 mg Oral Daily  . aspirin-acetaminophen-caffeine  1 tablet Oral Once  . citalopram  40 mg Oral Daily  . enoxaparin (LOVENOX) injection  40 mg Subcutaneous Q24H  . insulin aspart  0-5 Units Subcutaneous QHS  . insulin aspart  0-9 Units Subcutaneous TID WC  . pneumococcal 23 valent vaccine  0.5 mL Intramuscular Tomorrow-1000  . sodium chloride flush  3 mL Intravenous Q12H   Continuous Infusions: . sodium chloride     PRN Meds: sodium chloride, acetaminophen, hydrALAZINE, HYDROcodone-acetaminophen, labetalol, nicotine polacrilex, nitroGLYCERIN, ondansetron (ZOFRAN) IV, sodium chloride flush   Vital Signs    Vitals:   04/04/17 0650 04/04/17 0720 04/04/17 0750 04/04/17 0850  BP: (!) 184/89 136/64 (!) 121/58 102/71  Pulse:    84  Resp:    12  Temp:      TempSrc:      SpO2:    92%  Weight:      Height:        Intake/Output Summary (Last 24 hours) at 04/04/17 0905 Last data filed at 04/04/17 0458  Gross per 24 hour  Intake              490 ml  Output             1300 ml  Net             -810 ml   Filed Weights   04/02/17 2355 04/03/17 0654 04/04/17 0453  Weight: 197 lb 9.6 oz (89.6 kg) 197 lb 9.6 oz (89.6 kg) 194 lb 3.2 oz (88.1 kg)    Telemetry    NSR - Personally Reviewed  ECG    No new EKGs to review - Personally Reviewed  Physical Exam   GEN: No acute distress.   Neck: No JVD Cardiac: RRR, no murmurs, rubs, or gallops.  Respiratory: Clear to auscultation bilaterally. GI: Soft, nontender, non-distended  MS: No edema; No deformity. Neuro:  Nonfocal  Psych: Normal affect   Labs    Chemistry Recent Labs Lab 04/02/17 1139 04/03/17 0304 04/04/17 0340  NA 134* 137 139  K 3.8 2.9* 3.2*  CL 99* 101 105    CO2 24 26 25   GLUCOSE 188* 124* 116*  BUN 7 9 8   CREATININE 0.94 1.01* 0.72  CALCIUM 9.0 9.0 8.9  GFRNONAA >60 60* >60  GFRAA >60 >60 >60  ANIONGAP 11 10 9      Hematology Recent Labs Lab 04/02/17 1139 04/03/17 0304 04/04/17 0340  WBC 7.8 7.3 6.0  RBC 4.69 4.15 4.37  HGB 16.1* 14.4 15.1*  HCT 46.8* 42.2 44.2  MCV 99.8 101.7* 101.1*  MCH 34.3* 34.7* 34.6*  MCHC 34.4 34.1 34.2  RDW 12.9 13.0 12.9  PLT 193 178 181    Cardiac Enzymes Recent Labs Lab 04/02/17 2358 04/03/17 0304 04/03/17 0833  TROPONINI 0.03* 0.04* 0.03*    Recent Labs Lab 04/02/17 1206 04/02/17 2126  TROPIPOC 0.00 0.01     BNP Recent Labs Lab 04/02/17 2358  BNP 110.0*     DDimer No results for input(s): DDIMER in the last 168 hours.   Radiology    Dg Chest 2 View  Result Date: 04/02/2017 CLINICAL DATA:  Chest pressure. EXAM: CHEST  2 VIEW COMPARISON:  None. FINDINGS: Generalized interstitial coarsening, favored bronchitic. There is no vascular pedicle widening or Kerley lines. No pleural effusion or cardiomegaly. No air bronchograms. There is an ovoid opacity projecting posterior to the trachea in the lateral projection, nearly 3 cm in length. No acute osseous finding. IMPRESSION: 1. Unexpected nodular density in the lateral view, recommend chest CT in this smoker. 2. Interstitial coarsening, likely bronchitic. Electronically Signed   By: Monte Fantasia M.D.   On: 04/02/2017 12:27   Ct Chest Wo Contrast  Result Date: 04/02/2017 CLINICAL DATA:  Initial evaluation for incidental lung nodule, chest pressure. Smoker. EXAM: CT CHEST WITHOUT CONTRAST TECHNIQUE: Multidetector CT imaging of the chest was performed following the standard protocol without IV contrast. COMPARISON:  Prior radiograph from earlier same day. FINDINGS: Cardiovascular: Intrathoracic aorta of normal caliber without aneurysm. Mild atheromatous plaque within the aortic arch and descending intrathoracic aorta. Visualized great  vessels within normal limits. Heart size within normal limits. No pericardial effusion. Mediastinum/Nodes: Visualized thyroid is normal. Mildly enlarged right paratracheal node measuring 11 mm positioned just above the carina (series 4, image 46). Additional mildly prominent 13 mm subcarinal lymph node (Series 4, image 60). No other pathologically enlarged mediastinal, hilar, or axillary lymph nodes identified. Esophagus within normal limits. Lungs/Pleura: Tracheobronchial tree is patent. Mild scattered peribronchial thickening, suspected to be related to history of smoking. Hazy subsegmental atelectatic changes present dependently within the lower lobes bilaterally. No focal infiltrates. Mild pulmonary interstitial congestion without overt pulmonary edema. No pleural effusion. No pneumothorax. No worrisome pulmonary nodule or mass. No findings to correspond with previously noted abnormality seen on prior chest x-ray. Upper Abdomen: Hepatic steatosis. Remainder the visualized upper abdomen otherwise within normal limits. Musculoskeletal: 2 cm cystic lesion noted within the subcutaneous fat of the left back, likely a small sebaceous cyst. External soft tissues otherwise unremarkable. No acute osseous abnormality. No worrisome lytic or blastic osseous lesions. IMPRESSION: 1. No pulmonary nodule or mass identified. No findings to correlate with previously noted nodular density on prior x-ray. This may have been related to summation of shadows. 2. Few mildly enlarged mediastinal lymph nodes as above, indeterminate, but may be reactive. 3. Scattered peribronchial thickening, suspected to be related to history of smoking. 4. Mild pulmonary interstitial congestion with bibasilar subsegmental atelectasis. No other active cardiopulmonary disease. 5. Hepatic steatosis. 6. Mild aortic atherosclerosis. Electronically Signed   By: Jeannine Boga M.D.   On: 04/02/2017 23:52    Cardiac Studies   Study Conclusions  -  Left ventricle: The cavity size was normal. There was mild   concentric hypertrophy. Systolic function was normal. The   estimated ejection fraction was in the range of 50% to 55%.   Possible mild hypokinesis of the basal-midlateral myocardium.   Features are consistent with a pseudonormal left ventricular   filling pattern, with concomitant abnormal relaxation and   increased filling pressure (grade 2 diastolic dysfunction).  Patient Profile     61 y.o. female with PMH significant for HLD, not on statin due to intolerance, depression, and allergies. She presented to [facility] with a 2-week history of headache and 1-day history of associated hypertension and chest pain.  Assessment & Plan    1. Chest pain, headache secondary to HTN urgency - troponin 0.01 --> 0.03 --> 0.04-->0.03 secondary to demand ischemia in the setting of hypertensive urgency - EKG with nonspecific ST changes - pt presented with 2-week history of headache. Home BP check showed BP in the 190s/90s -  on arrival to ED, pressures were 200/100s and treated with IV labetalol x 2 - BP now better controlled - 102/78mmHg - BNP slightly elevated to 110 Her chest pain coincided with hypertensive urgency vs emergency and is atypical in nature. She has risk factors for ACS, including previously diagnosed HLD, obesity, and current smoker. However, her chest pain is likely related to her hypertensive urgency vs emergency.  - 2D echo showed low normal LVF with EF 50-55% with ? Mild HK of the basal to mid lateral wall and diastolic dysfunction - plan for nuclear stress test today to rule out ischemia  2. HTN urgency - BP much improved after IV BB and Hydralazine - continue amlodipine - start carvedilol 6.25mg  BID - given her persistent hypokalemia despite no diuretics and continued low K+ with repletion, ? Whether she could have hyperaldosteronism as etiology of HTN.   - Check Aldo/renin ratio  2. HLD - LDL 137 (goal < 160) -  simvastatin is on her med list from home but says she has not taken it in a year  3. Elevated TSH - TSH this admission 8.806 - further workup per TR[facility]  4. Hypokalemia - K 3.3 --> 2.9-->3.2 - replace per primary team  5. Elevated sCr - creatinine now normalized  6. Current smoker - encourage smoking cessation  Signed, Fransico Him, MD  04/04/2017, 9:05 AM

## 2017-04-04 NOTE — Progress Notes (Signed)
Cardiology paged regarding patient's persistent HTN. SBP maintaining in the 180-190's. PRN Labetalol and Hydralazine given with no fluctuation. Awaiting return call.

## 2017-04-05 DIAGNOSIS — R079 Chest pain, unspecified: Secondary | ICD-10-CM | POA: Diagnosis not present

## 2017-04-05 DIAGNOSIS — R748 Abnormal levels of other serum enzymes: Secondary | ICD-10-CM | POA: Diagnosis not present

## 2017-04-05 DIAGNOSIS — F17200 Nicotine dependence, unspecified, uncomplicated: Secondary | ICD-10-CM | POA: Diagnosis not present

## 2017-04-05 DIAGNOSIS — E7800 Pure hypercholesterolemia, unspecified: Secondary | ICD-10-CM | POA: Diagnosis not present

## 2017-04-05 DIAGNOSIS — I160 Hypertensive urgency: Secondary | ICD-10-CM | POA: Diagnosis not present

## 2017-04-05 LAB — CBC
HCT: 42.9 % (ref 36.0–46.0)
Hemoglobin: 14.1 g/dL (ref 12.0–15.0)
MCH: 33.6 pg (ref 26.0–34.0)
MCHC: 32.9 g/dL (ref 30.0–36.0)
MCV: 102.1 fL — ABNORMAL HIGH (ref 78.0–100.0)
Platelets: 175 10*3/uL (ref 150–400)
RBC: 4.2 MIL/uL (ref 3.87–5.11)
RDW: 13 % (ref 11.5–15.5)
WBC: 8.1 10*3/uL (ref 4.0–10.5)

## 2017-04-05 LAB — BASIC METABOLIC PANEL
Anion gap: 8 (ref 5–15)
BUN: 12 mg/dL (ref 6–20)
CO2: 26 mmol/L (ref 22–32)
Calcium: 8.7 mg/dL — ABNORMAL LOW (ref 8.9–10.3)
Chloride: 102 mmol/L (ref 101–111)
Creatinine, Ser: 0.91 mg/dL (ref 0.44–1.00)
GFR calc Af Amer: >60 mL/min (ref >60)
GFR calc non Af Amer: >60 mL/min (ref >60)
Glucose, Bld: 94 mg/dL (ref 65–99)
Potassium: 3.6 mmol/L (ref 3.5–5.1)
Sodium: 136 mmol/L (ref 135–145)

## 2017-04-05 LAB — GLUCOSE, CAPILLARY: Glucose-Capillary: 99 mg/dL (ref 65–99)

## 2017-04-05 MED ORDER — CARVEDILOL 6.25 MG PO TABS: 6.2500 mg | ORAL_TABLET | Freq: Two times a day (BID) | ORAL | 0 refills | Status: DC

## 2017-04-05 MED ORDER — AMLODIPINE BESYLATE 10 MG PO TABS: 10.0000 mg | ORAL_TABLET | Freq: Every day | ORAL | 0 refills | Status: DC

## 2017-04-05 MED ORDER — NICOTINE POLACRILEX 2 MG MT GUM: 2.0000 mg | CHEWING_GUM | OROMUCOSAL | 0 refills | Status: DC | PRN

## 2017-04-05 MED ORDER — ASPIRIN 81 MG PO TBEC: 81.0000 mg | DELAYED_RELEASE_TABLET | Freq: Every day | ORAL | Status: DC

## 2017-04-05 NOTE — Discharge Summary (Addendum)
Physician Discharge Summary  Kimberly Christian VZD:[id number] DOB: [birth year] DOA: 04/02/2017  PCP: Lucille Passy, MD  Admit date: 04/02/2017 Discharge date: 04/05/2017   Recommendations for Outpatient Follow-Up:   BP medication titration - Aldo/renin ratio pending -TSH in 4-6 weeks   Discharge Diagnosis:   Principal Problem:   Chest pain Active Problems:   TOBACCO ABUSE   HLD (hyperlipidemia)   Hyperglycemia   Depression   Hypertensive urgency   Elevated troponin   Discharge disposition:  Home.  Discharge Condition: Improved.  Diet recommendation: Low sodium, heart healthy  Wound care: None.   History of Present Illness:   Kimberly Christian is a 61 y.o. female with medical history significant for depression, hyperlipidemia, and tobacco abuse, now presenting to the emergency department for evaluation of intermittent headaches and elevated blood pressure. Patient reports experiencing mild intermittent frontal headaches for the past couple weeks, noted to be unusual for her, but responding well to ibuprofen at home. She then developed some pressure sensation in the central chest this morning which lasted approximately 2 hours, was described as mild to moderate in intensity, localized to the central chest, not associated with diaphoresis, nausea, or shortness of breath, and with no alleviating or exacerbating factors identified. This resolved spontaneously. She decided to check her blood pressure today, found her systolic pressures to be in the 180s, and called her PCP for advice. PCP directed the patient to the emergency department for evaluation. Kimberly Christian denies any palpitations, denies any fevers or chills, and denies any change in her vision, hearing, or focal numbness or weakness. Denies any use of alcohol or illicit substances. She does not take any prescription medications. Headache is described as mild to moderate in intensity, dull and aching in character, localized to the  frontal region, alleviated with ibuprofen, and with no exacerbating factors identified.   Hospital Course by Problem:   Chest pain, headache secondary to HTN urgency - troponin 0.01 --> 0.03 --> 0.04-->0.03 secondary to demand ischemia in the setting of hypertensive urgency - EKG with nonspecific ST changes - pt presented with 2-week history of headache. Home BP check showed BP in the 190s/90s - on arrival to ED, pressures were 200/100s and treated with IV labetalol x 2 - BP now better controlled - 141/37mmHg - BNP slightly elevated to 110 on admission Her chest pain coincided with hypertensive urgency vs emergency and is atypical in nature. She has risk factors for ACS, including previously diagnosed HLD, obesity, and current smoker. However, her chest pain is likely related to her hypertensive urgency vs emergency.  - 2D echo showed low normal LVF with EF 50-55% with ? Mild HK of the basal to mid lateral wall and diastolic dysfunction - Nuclear stress test showed no ischemia and normal LVF- low risk  HTN urgency - BP much improved after starting BB - continue amlodipine - continue carvedilol 6.25mg  BID - given her persistent hypokalemia despite no diuretics and continued low K+ with repletion, ? Whether she could have hyperaldosteronism as etiology of HTN.   - TSH elevated  - Aldo/renin ratio pending  Incidental lung nodule on x ray -per CT scan, appears to be shadow  Hyperglycemia -monitor outpatient  HLD - LDL 137 (goal < 160) - simvastatin is on her med list from home but says she has not taken it in a year-- resume  Elevated TSH - TSH this admission 8.806 -repeat as outpatient -free t4 normal  Hypokalemia - K 3.3 --> 2.9-->3.2-->3.6 -  replaced  Elevated sCr - creatinine now normalized at 0.91  Tobacco abuse - encourage smoking cessation    Medical Consultants:   CARDS   Discharge Exam:   Vitals:   04/05/17 0500 04/05/17 0910  BP: (!) 141/74 (!)  149/89  Pulse: 73   Resp: 16 14  Temp: 98.5 F (36.9 C)    Vitals:   04/04/17 1550 04/04/17 2100 04/05/17 0500 04/05/17 0910  BP: (!) 134/58 139/69 (!) 141/74 (!) 149/89  Pulse: 88 82 73   Resp: 17 (!) 25 16 14   Temp:  98.6 F (37 C) 98.5 F (36.9 C)   TempSrc:  Oral Oral   SpO2: 93% 95% 99% 95%  Weight:   88.6 kg (195 lb 6.4 oz)   Height:        Gen:  NAD- headache resolved    The results of significant diagnostics from this hospitalization (including imaging, microbiology, ancillary and laboratory) are listed below for reference.     Procedures and Diagnostic Studies:   Dg Chest 2 View  Result Date: 04/02/2017 CLINICAL DATA:  Chest pressure. EXAM: CHEST  2 VIEW COMPARISON:  None. FINDINGS: Generalized interstitial coarsening, favored bronchitic. There is no vascular pedicle widening or Kerley lines. No pleural effusion or cardiomegaly. No air bronchograms. There is an ovoid opacity projecting posterior to the trachea in the lateral projection, nearly 3 cm in length. No acute osseous finding. IMPRESSION: 1. Unexpected nodular density in the lateral view, recommend chest CT in this smoker. 2. Interstitial coarsening, likely bronchitic. Electronically Signed   By: Monte Fantasia M.D.   On: 04/02/2017 12:27   Ct Chest Wo Contrast  Result Date: 04/02/2017 CLINICAL DATA:  Initial evaluation for incidental lung nodule, chest pressure. Smoker. EXAM: CT CHEST WITHOUT CONTRAST TECHNIQUE: Multidetector CT imaging of the chest was performed following the standard protocol without IV contrast. COMPARISON:  Prior radiograph from earlier same day. FINDINGS: Cardiovascular: Intrathoracic aorta of normal caliber without aneurysm. Mild atheromatous plaque within the aortic arch and descending intrathoracic aorta. Visualized great vessels within normal limits. Heart size within normal limits. No pericardial effusion. Mediastinum/Nodes: Visualized thyroid is normal. Mildly enlarged right paratracheal  node measuring 11 mm positioned just above the carina (series 4, image 46). Additional mildly prominent 13 mm subcarinal lymph node (Series 4, image 60). No other pathologically enlarged mediastinal, hilar, or axillary lymph nodes identified. Esophagus within normal limits. Lungs/Pleura: Tracheobronchial tree is patent. Mild scattered peribronchial thickening, suspected to be related to history of smoking. Hazy subsegmental atelectatic changes present dependently within the lower lobes bilaterally. No focal infiltrates. Mild pulmonary interstitial congestion without overt pulmonary edema. No pleural effusion. No pneumothorax. No worrisome pulmonary nodule or mass. No findings to correspond with previously noted abnormality seen on prior chest x-ray. Upper Abdomen: Hepatic steatosis. Remainder the visualized upper abdomen otherwise within normal limits. Musculoskeletal: 2 cm cystic lesion noted within the subcutaneous fat of the left back, likely a small sebaceous cyst. External soft tissues otherwise unremarkable. No acute osseous abnormality. No worrisome lytic or blastic osseous lesions. IMPRESSION: 1. No pulmonary nodule or mass identified. No findings to correlate with previously noted nodular density on prior x-ray. This may have been related to summation of shadows. 2. Few mildly enlarged mediastinal lymph nodes as above, indeterminate, but may be reactive. 3. Scattered peribronchial thickening, suspected to be related to history of smoking. 4. Mild pulmonary interstitial congestion with bibasilar subsegmental atelectasis. No other active cardiopulmonary disease. 5. Hepatic steatosis. 6. Mild aortic atherosclerosis. Electronically  Signed   By: Jeannine Boga M.D.   On: 04/02/2017 23:52     Labs:   Basic Metabolic Panel:  Recent Labs Lab 04/02/17 1139 04/03/17 0304 04/03/17 1248 04/04/17 0340 04/05/17 0247  NA 134* 137  --  139 136  K 3.8 2.9*  --  3.2* 3.6  CL 99* 101  --  105 102  CO2  24 26  --  25 26  GLUCOSE 188* 124*  --  116* 94  BUN 7 9  --  8 12  CREATININE 0.94 1.01*  --  0.72 0.91  CALCIUM 9.0 9.0  --  8.9 8.7*  MG  --   --  2.1  --   --    GFR Estimated Creatinine Clearance: 67.4 mL/min (by C-G formula based on SCr of 0.91 mg/dL). Liver Function Tests: No results for input(s): AST, ALT, ALKPHOS, BILITOT, PROT, ALBUMIN in the last 168 hours. No results for input(s): LIPASE, AMYLASE in the last 168 hours. No results for input(s): AMMONIA in the last 168 hours. Coagulation profile No results for input(s): INR, PROTIME in the last 168 hours.  CBC:  Recent Labs Lab 04/02/17 1139 04/03/17 0304 04/04/17 0340 04/05/17 0247  WBC 7.8 7.3 6.0 8.1  NEUTROABS  --  4.4  --   --   HGB 16.1* 14.4 15.1* 14.1  HCT 46.8* 42.2 44.2 42.9  MCV 99.8 101.7* 101.1* 102.1*  PLT 193 178 181 175   Cardiac Enzymes:  Recent Labs Lab 04/02/17 2358 04/03/17 0304 04/03/17 0833  TROPONINI 0.03* 0.04* 0.03*   BNP: Invalid input(s): POCBNP CBG:  Recent Labs Lab 04/04/17 0807 04/04/17 1112 04/04/17 1605 04/04/17 2053 04/05/17 0722  GLUCAP 153* 116* 180* 77 99   D-Dimer No results for input(s): DDIMER in the last 72 hours. Hgb A1c No results for input(s): HGBA1C in the last 72 hours. Lipid Profile  Recent Labs  04/03/17 0833  CHOL 205*  HDL 50  LDLCALC 137*  TRIG 89  CHOLHDL 4.1   Thyroid function studies  Recent Labs  04/02/17 2358  TSH 8.806*   Anemia work up No results for input(s): VITAMINB12, FOLATE, FERRITIN, TIBC, IRON, RETICCTPCT in the last 72 hours. Microbiology Recent Results (from the past 240 hour(s))  MRSA PCR Screening     Status: None   Collection Time: 04/02/17 11:57 PM  Result Value Ref Range Status   MRSA by PCR NEGATIVE NEGATIVE Final    Comment:        The GeneXpert MRSA Assay (FDA approved for NASAL specimens only), is one component of a comprehensive MRSA colonization surveillance program. It is not intended to  diagnose MRSA infection nor to guide or monitor treatment for MRSA infections.      Discharge Instructions:   Discharge Instructions    Diet - low sodium heart healthy    Complete by:  As directed    Diet Carb Modified    Complete by:  As directed    Increase activity slowly    Complete by:  As directed      Allergies as of 04/05/2017   No Known Allergies     Medication List    STOP taking these medications   chlorpheniramine-HYDROcodone 10-8 MG/5ML Suer Commonly known as:  TUSSIONEX PENNKINETIC ER     TAKE these medications   amLODipine 10 MG tablet Commonly known as:  NORVASC Take 1 tablet (10 mg total) by mouth daily.   aspirin 81 MG EC tablet Take 1 tablet (  81 mg total) by mouth daily.   carvedilol 6.25 MG tablet Commonly known as:  COREG Take 1 tablet (6.25 mg total) by mouth 2 (two) times daily with a meal.   citalopram 40 MG tablet Commonly known as:  CELEXA TAKE 1 BY MOUTH DAILY What changed:  how much to take  how to take this  when to take this  additional instructions   ibuprofen 200 MG tablet Commonly known as:  ADVIL,MOTRIN Take 800 mg by mouth every 6 (six) hours as needed for mild pain.   nicotine polacrilex 2 MG gum Commonly known as:  NICORETTE Take 1 each (2 mg total) by mouth as needed for smoking cessation.   simvastatin 10 MG tablet Commonly known as:  ZOCOR TAKE 1 TABLET (10 MG TOTAL) BY MOUTH AT BEDTIME.      Follow-up Information    Lucille Passy, MD Follow up.   Specialty:  Family Medicine Why:  follow up of blood sugars titration of BP medications Contact information: [place] [address] [phone]            Time coordinating discharge: 25 min  Signed:  Marticia Reifschneider U Orianna Biskup   [facility] 04/05/2017, 10:20 AM

## 2017-04-05 NOTE — Progress Notes (Signed)
Progress Note  Patient Name: Kimberly Christian Date of Encounter: 04/05/2017  Primary Cardiologist: Dr. Radford Pax  Subjective   No chest pain, SOB or headaches  Inpatient Medications    Scheduled Meds: . amLODipine  10 mg Oral Daily  . aspirin EC  81 mg Oral Daily  . carvedilol  6.25 mg Oral BID WC  . citalopram  40 mg Oral Daily  . enoxaparin (LOVENOX) injection  40 mg Subcutaneous Q24H  . insulin aspart  0-5 Units Subcutaneous QHS  . insulin aspart  0-9 Units Subcutaneous TID WC  . pneumococcal 23 valent vaccine  0.5 mL Intramuscular Tomorrow-1000  . sodium chloride flush  3 mL Intravenous Q12H   Continuous Infusions: . sodium chloride     PRN Meds: sodium chloride, acetaminophen, hydrALAZINE, HYDROcodone-acetaminophen, labetalol, nicotine polacrilex, nitroGLYCERIN, ondansetron (ZOFRAN) IV, sodium chloride flush, technetium tetrofosmin   Vital Signs    Vitals:   04/04/17 1412 04/04/17 1550 04/04/17 2100 04/05/17 0500  BP: (!) 173/87 (!) 134/58 139/69 (!) 141/74  Pulse:  88 82 73  Resp:  17 (!) 25 16  Temp:   98.6 F (37 C) 98.5 F (36.9 C)  TempSrc:   Oral Oral  SpO2:  93% 95% 99%  Weight:    195 lb 6.4 oz (88.6 kg)  Height:        Intake/Output Summary (Last 24 hours) at 04/05/17 0825 Last data filed at 04/04/17 2217  Gross per 24 hour  Intake              360 ml  Output              400 ml  Net              -40 ml   Filed Weights   04/03/17 0654 04/04/17 0453 04/05/17 0500  Weight: 197 lb 9.6 oz (89.6 kg) 194 lb 3.2 oz (88.1 kg) 195 lb 6.4 oz (88.6 kg)    Telemetry    NSR - Personally Reviewed  ECG    No new EKG to review - Personally Reviewed  Physical Exam   GEN: WD, WN in NAD Neck: No JVD or bruit Cardiac: RRR with no M/R/G Respiratory: CTA bilaterally GI: soft, NT, ND with active BS MS: no edema Neuro:A&O x 3 Psych:normal mood  Labs    Chemistry  Recent Labs Lab 04/03/17 0304 04/04/17 0340 04/05/17 0247  NA 137 139 136  K  2.9* 3.2* 3.6  CL 101 105 102  CO2 26 25 26   GLUCOSE 124* 116* 94  BUN 9 8 12   CREATININE 1.01* 0.72 0.91  CALCIUM 9.0 8.9 8.7*  GFRNONAA 60* >60 >60  GFRAA >60 >60 >60  ANIONGAP 10 9 8      Hematology  Recent Labs Lab 04/03/17 0304 04/04/17 0340 04/05/17 0247  WBC 7.3 6.0 8.1  RBC 4.15 4.37 4.20  HGB 14.4 15.1* 14.1  HCT 42.2 44.2 42.9  MCV 101.7* 101.1* 102.1*  MCH 34.7* 34.6* 33.6  MCHC 34.1 34.2 32.9  RDW 13.0 12.9 13.0  PLT 178 181 175    Cardiac Enzymes  Recent Labs Lab 04/02/17 2358 04/03/17 0304 04/03/17 0833  TROPONINI 0.03* 0.04* 0.03*     Recent Labs Lab 04/02/17 1206 04/02/17 2126  TROPIPOC 0.00 0.01     BNP  Recent Labs Lab 04/02/17 2358  BNP 110.0*     DDimer No results for input(s): DDIMER in the last 168 hours.   Radiology    Nm  Myocar Multi W/spect W/wall Motion / Ef  Result Date: 04/04/2017 CLINICAL DATA:  Central chest pain. Hypertension and hyperlipidemia. EXAM: MYOCARDIAL IMAGING WITH SPECT (REST AND PHARMACOLOGIC-STRESS) GATED LEFT VENTRICULAR WALL MOTION STUDY LEFT VENTRICULAR EJECTION FRACTION TECHNIQUE: Standard myocardial SPECT imaging was performed after resting intravenous injection of 10 mCi Tc-22m tetrofosmin. Subsequently, intravenous infusion of Lexiscan was performed under the supervision of the Cardiology staff. At peak effect of the drug, 30 mCi Tc-61m tetrofosmin was injected intravenously and standard myocardial SPECT imaging was performed. Quantitative gated imaging was also performed to evaluate left ventricular wall motion, and estimate left ventricular ejection fraction. COMPARISON:  None. FINDINGS: Perfusion: No decreased activity in the left ventricle on stress imaging to suggest reversible ischemia or infarction. Wall Motion: Normal left ventricular wall motion. No left ventricular dilation. Left Ventricular Ejection Fraction: 61 % End diastolic volume 78 ml End systolic volume 30 ml IMPRESSION: 1. No reversible  ischemia or infarction. 2. Normal left ventricular wall motion. 3. Left ventricular ejection fraction 61% 4. Non invasive risk stratification*:  Low *2012 Appropriate Use Criteria for Coronary Revascularization Focused Update: J Am Coll Cardiol. 5053;97(6):734-193. http://content.[email or web address].aspx?articleid=[email or web address] Electronically Signed   By: Earle Gell M.D.   On: 04/04/2017 16:16    Cardiac Studies   2D echo Study Conclusions  - Left ventricle: The cavity size was normal. There was mild   concentric hypertrophy. Systolic function was normal. The   estimated ejection fraction was in the range of 50% to 55%.   Possible mild hypokinesis of the basal-midlateral myocardium.   Features are consistent with a pseudonormal left ventricular   filling pattern, with concomitant abnormal relaxation and   increased filling pressure (grade 2 diastolic dysfunction).  Nuclear stress test 03/2017 IMPRESSION: 1. No reversible ischemia or infarction.  2. Normal left ventricular wall motion.  3. Left ventricular ejection fraction 61%  4. Non invasive risk stratification*:  Low  Patient Profile     61 y.o. female with PMH significant for HLD, not on statin due to intolerance, depression, and allergies. She presented to [facility] with a 2-week history of headache and 1-day history of associated hypertension and chest pain.  Assessment & Plan    1. Chest pain, headache secondary to HTN urgency - troponin 0.01 --> 0.03 --> 0.04-->0.03 secondary to demand ischemia in the setting of hypertensive urgency - EKG with nonspecific ST changes - pt presented with 2-week history of headache. Home BP check showed BP in the 190s/90s - on arrival to ED, pressures were 200/100s and treated with IV labetalol x 2 - BP now better controlled - 141/72mmHg - BNP slightly elevated to 110 on admission Her chest pain coincided with hypertensive urgency vs emergency and is atypical in nature. She has risk factors for  ACS, including previously diagnosed HLD, obesity, and current smoker. However, her chest pain is likely related to her hypertensive urgency vs emergency.  - 2D echo showed low normal LVF with EF 50-55% with ? Mild HK of the basal to mid lateral wall and diastolic dysfunction - Nuclear stress test showed no ischemia and normal LVF  2. HTN urgency - BP much improved after starting BB - continue amlodipine - continue carvedilol 6.25mg  BID - given her persistent hypokalemia despite no diuretics and continued low K+ with repletion, ? Whether she could have hyperaldosteronism as etiology of HTN.   - TSH elevated  - Aldo/renin ratio pending  3. HLD - LDL 137 (goal < 160) - simvastatin is on her med list from  home but says she has not taken it in a year  4. Elevated TSH - TSH this admission 8.806 - further workup per TRH  5. Hypokalemia - K 3.3 --> 2.9-->3.2-->3.6 - replace per primary team  6. Elevated sCr - creatinine now normalized at 0.91  7. Current smoker - encourage smoking cessation  No new recs at this time.  Will sign off.  Call with any questions.  Signed, Fransico Him, MD  04/05/2017, 8:25 AM

## 2017-04-07 LAB — ALDOSTERONE + RENIN ACTIVITY W/ RATIO
ALDO / PRA Ratio: 4.2 (ref 0.0–30.0)
Aldosterone: 28.3 ng/dL (ref 0.0–30.0)
PRA LC/MS/MS: 6.78 ng/mL/hr — ABNORMAL HIGH (ref 0.167–5.380)

## 2017-04-09 ENCOUNTER — Encounter: Payer: Self-pay | Admitting: Family Medicine

## 2017-04-09 ENCOUNTER — Ambulatory Visit (INDEPENDENT_AMBULATORY_CARE_PROVIDER_SITE_OTHER): Payer: BLUE CROSS/BLUE SHIELD | Admitting: Family Medicine

## 2017-04-09 VITALS — BP 140/98 | HR 81 | Ht 61.0 in | Wt 194.0 lb

## 2017-04-09 DIAGNOSIS — E876 Hypokalemia: Secondary | ICD-10-CM

## 2017-04-09 DIAGNOSIS — R946 Abnormal results of thyroid function studies: Secondary | ICD-10-CM | POA: Diagnosis not present

## 2017-04-09 DIAGNOSIS — R079 Chest pain, unspecified: Secondary | ICD-10-CM

## 2017-04-09 DIAGNOSIS — I160 Hypertensive urgency: Secondary | ICD-10-CM

## 2017-04-09 DIAGNOSIS — E2689 Other hyperaldosteronism: Secondary | ICD-10-CM | POA: Diagnosis not present

## 2017-04-09 DIAGNOSIS — Z09 Encounter for follow-up examination after completed treatment for conditions other than malignant neoplasm: Secondary | ICD-10-CM | POA: Diagnosis not present

## 2017-04-09 DIAGNOSIS — R7989 Other specified abnormal findings of blood chemistry: Secondary | ICD-10-CM

## 2017-04-09 DIAGNOSIS — E269 Hyperaldosteronism, unspecified: Secondary | ICD-10-CM | POA: Insufficient documentation

## 2017-04-09 LAB — TSH: TSH: 2.28 u[IU]/mL (ref 0.35–4.50)

## 2017-04-09 LAB — T4, FREE: Free T4: 0.8 ng/dL (ref 0.60–1.60)

## 2017-04-09 LAB — COMPREHENSIVE METABOLIC PANEL
ALT: 28 U/L (ref 0–35)
AST: 26 U/L (ref 0–37)
Albumin: 4.1 g/dL (ref 3.5–5.2)
Alkaline Phosphatase: 68 U/L (ref 39–117)
BUN: 10 mg/dL (ref 6–23)
CO2: 26 mEq/L (ref 19–32)
Calcium: 9.1 mg/dL (ref 8.4–10.5)
Chloride: 98 mEq/L (ref 96–112)
Creatinine, Ser: 0.87 mg/dL (ref 0.40–1.20)
GFR: 70.74 mL/min (ref >60.00)
Glucose, Bld: 78 mg/dL (ref 70–99)
Potassium: 3.8 mEq/L (ref 3.5–5.1)
Sodium: 136 mEq/L (ref 135–145)
Total Bilirubin: 0.5 mg/dL (ref 0.2–1.2)
Total Protein: 6.9 g/dL (ref 6.0–8.3)

## 2017-04-09 NOTE — Assessment & Plan Note (Signed)
Repeat potassium today

## 2017-04-09 NOTE — Assessment & Plan Note (Signed)
BP improving. Refer to end for abnormal PRA. The patient indicates understanding of these issues and agrees with the plan. No changes made to rxs.

## 2017-04-09 NOTE — Assessment & Plan Note (Signed)
Repeat TSH and FT4 today- likely due to hypertensive urgency.

## 2017-04-09 NOTE — Progress Notes (Signed)
Subjective:   Patient ID: Kimberly Christian, female    DOB: 1956/07/06, 61 y.o.   MRN: [id number]  Kimberly Christian is a pleasant 61 y.o. year old female who presents to clinic today with Hospitalization Follow-up and Hypertension  on 04/09/2017  HPI:  Admitted to [facility] 7/9- 04/05/17. Notes reviewed.  Presented with elevated blood pressure and intermittent headaches over past few weeks. Also had some chest pain the morning she presented to the ER.  Troponin was elevated with non specific ST changes- admitted for CP rule out and hypertensive urgency. On arrival to ER, BP was 200/100- treated with IV labetolol x 2. BNP was 110. Echo- EF 50-55% Nuclear stress test- no ischemia, low risk, normal LVF  Discharged home on amlodipine and coreg 6.25 mg twice daily. Also had persistent hypokalemia despite being on diuretics so aldo/renin ratio check- PRA was abnormal.  TSH was elevated. Normal T4. Lab Results  Component Value Date   TSH 8.806 (H) 04/02/2017   Chest pain has resolved.   Dg Chest 2 View  Result Date: 04/02/2017 CLINICAL DATA:  Chest pressure. EXAM: CHEST  2 VIEW COMPARISON:  None. FINDINGS: Generalized interstitial coarsening, favored bronchitic. There is no vascular pedicle widening or Kerley lines. No pleural effusion or cardiomegaly. No air bronchograms. There is an ovoid opacity projecting posterior to the trachea in the lateral projection, nearly 3 cm in length. No acute osseous finding. IMPRESSION: 1. Unexpected nodular density in the lateral view, recommend chest CT in this smoker. 2. Interstitial coarsening, likely bronchitic. Electronically Signed   By: Monte Fantasia M.D.   On: 04/02/2017 12:27   Ct Chest Wo Contrast  Result Date: 04/02/2017 CLINICAL DATA:  Initial evaluation for incidental lung nodule, chest pressure. Smoker. EXAM: CT CHEST WITHOUT CONTRAST TECHNIQUE: Multidetector CT imaging of the chest was performed following the standard protocol without IV  contrast. COMPARISON:  Prior radiograph from earlier same day. FINDINGS: Cardiovascular: Intrathoracic aorta of normal caliber without aneurysm. Mild atheromatous plaque within the aortic arch and descending intrathoracic aorta. Visualized great vessels within normal limits. Heart size within normal limits. No pericardial effusion. Mediastinum/Nodes: Visualized thyroid is normal. Mildly enlarged right paratracheal node measuring 11 mm positioned just above the carina (series 4, image 46). Additional mildly prominent 13 mm subcarinal lymph node (Series 4, image 60). No other pathologically enlarged mediastinal, hilar, or axillary lymph nodes identified. Esophagus within normal limits. Lungs/Pleura: Tracheobronchial tree is patent. Mild scattered peribronchial thickening, suspected to be related to history of smoking. Hazy subsegmental atelectatic changes present dependently within the lower lobes bilaterally. No focal infiltrates. Mild pulmonary interstitial congestion without overt pulmonary edema. No pleural effusion. No pneumothorax. No worrisome pulmonary nodule or mass. No findings to correspond with previously noted abnormality seen on prior chest x-ray. Upper Abdomen: Hepatic steatosis. Remainder the visualized upper abdomen otherwise within normal limits. Musculoskeletal: 2 cm cystic lesion noted within the subcutaneous fat of the left back, likely a small sebaceous cyst. External soft tissues otherwise unremarkable. No acute osseous abnormality. No worrisome lytic or blastic osseous lesions. IMPRESSION: 1. No pulmonary nodule or mass identified. No findings to correlate with previously noted nodular density on prior x-ray. This may have been related to summation of shadows. 2. Few mildly enlarged mediastinal lymph nodes as above, indeterminate, but may be reactive. 3. Scattered peribronchial thickening, suspected to be related to history of smoking. 4. Mild pulmonary interstitial congestion with bibasilar  subsegmental atelectasis. No other active cardiopulmonary disease. 5. Hepatic steatosis. 6. Mild  aortic atherosclerosis. Electronically Signed   By: Jeannine Boga M.D.   On: 04/02/2017 23:52   Nm Myocar Multi W/spect W/wall Motion / Ef  Result Date: 04/04/2017 CLINICAL DATA:  Central chest pain. Hypertension and hyperlipidemia. EXAM: MYOCARDIAL IMAGING WITH SPECT (REST AND PHARMACOLOGIC-STRESS) GATED LEFT VENTRICULAR WALL MOTION STUDY LEFT VENTRICULAR EJECTION FRACTION TECHNIQUE: Standard myocardial SPECT imaging was performed after resting intravenous injection of 10 mCi Tc-32m tetrofosmin. Subsequently, intravenous infusion of Lexiscan was performed under the supervision of the Cardiology staff. At peak effect of the drug, 30 mCi Tc-70m tetrofosmin was injected intravenously and standard myocardial SPECT imaging was performed. Quantitative gated imaging was also performed to evaluate left ventricular wall motion, and estimate left ventricular ejection fraction. COMPARISON:  None. FINDINGS: Perfusion: No decreased activity in the left ventricle on stress imaging to suggest reversible ischemia or infarction. Wall Motion: Normal left ventricular wall motion. No left ventricular dilation. Left Ventricular Ejection Fraction: 61 % End diastolic volume 78 ml End systolic volume 30 ml IMPRESSION: 1. No reversible ischemia or infarction. 2. Normal left ventricular wall motion. 3. Left ventricular ejection fraction 61% 4. Non invasive risk stratification*:  Low *2012 Appropriate Use Criteria for Coronary Revascularization Focused Update: J Am Coll Cardiol. 1017;51(0):258-527. http://content.[email or web address]?[email or web address] Electronically Signed   By: Earle Gell M.D.   On: 04/04/2017 16:16   Current Outpatient Prescriptions on File Prior to Visit  Medication Sig Dispense Refill  . amLODipine (NORVASC) 10 MG tablet Take 1 tablet (10 mg total) by mouth daily. 30 tablet 0  . aspirin EC 81 MG EC  tablet Take 1 tablet (81 mg total) by mouth daily.    . carvedilol (COREG) 6.25 MG tablet Take 1 tablet (6.25 mg total) by mouth 2 (two) times daily with a meal. 60 tablet 0  . citalopram (CELEXA) 40 MG tablet TAKE 1 BY MOUTH DAILY (Patient taking differently: Take 40 mg by mouth daily. ) 90 tablet 1  . ibuprofen (ADVIL,MOTRIN) 200 MG tablet Take 800 mg by mouth every 6 (six) hours as needed for mild pain.    . nicotine polacrilex (NICORETTE) 2 MG gum Take 1 each (2 mg total) by mouth as needed for smoking cessation. (Patient not taking: Reported on 04/09/2017) 100 tablet 0  . simvastatin (ZOCOR) 10 MG tablet TAKE 1 TABLET (10 MG TOTAL) BY MOUTH AT BEDTIME. (Patient not taking: Reported on 07/12/2016) 90 tablet 0   No current facility-administered medications on file prior to visit.     No Known Allergies  Past Medical History:  Diagnosis Date  . Allergy   . Depression   . Hyperlipidemia     Past Surgical History:  Procedure Laterality Date  . TUBAL LIGATION    . WISDOM TOOTH EXTRACTION      Family History  Problem Relation Age of Onset  . Cancer Mother        lung  . Pulmonary fibrosis Father     Social History   Social History  . Marital status: Single    Spouse name: N/A  . Number of children: N/A  . Years of education: N/A   Occupational History  . Not on file.   Social History Main Topics  . Smoking status: Current Every Day Smoker    Packs/day: 1.00    Years: 20.00    Types: Cigarettes  . Smokeless tobacco: Never Used  . Alcohol use 4.8 - 6.0 oz/week    8 - 10 Cans of beer per week     Comment:  Occasional  . Drug use: No  . Sexual activity: Not on file   Other Topics Concern  . Not on file   Social History Narrative  . No narrative on file   The PMH, PSH, Social History, Family History, Medications, and allergies have been reviewed in [facility], and have been updated if relevant.  Review of Systems  Constitutional: Negative.   HENT: Negative.   Eyes:  Negative.   Respiratory: Negative.   Cardiovascular: Negative.   Gastrointestinal: Negative.   Endocrine: Negative.   Musculoskeletal: Negative.   Allergic/Immunologic: Negative.   Neurological: Positive for headaches. Negative for dizziness, tremors, seizures, syncope, facial asymmetry, speech difficulty, weakness, light-headedness and numbness.  Hematological: Negative.   Psychiatric/Behavioral: Negative.   All other systems reviewed and are negative.      Objective:    BP (!) 140/98   Pulse 81   Ht 5\' 1"  (1.549 m)   Wt 194 lb (88 kg)   SpO2 99%   BMI 36.66 kg/m    Physical Exam  Constitutional: She is oriented to person, place, and time. She appears well-developed and well-nourished. No distress.  HENT:  Head: Normocephalic and atraumatic.  Eyes: Conjunctivae are normal.  Neck: Normal range of motion.  Cardiovascular: Normal rate and regular rhythm.   Pulmonary/Chest: Effort normal and breath sounds normal.  Musculoskeletal: Normal range of motion. She exhibits no edema.  Neurological: She is alert and oriented to person, place, and time. No cranial nerve deficit.  Skin: Skin is warm and dry. She is not diaphoretic.  Psychiatric: She has a normal mood and affect. Her behavior is normal. Judgment and thought content normal.  Nursing note and vitals reviewed.         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

## 2017-04-09 NOTE — Assessment & Plan Note (Signed)
Resolved with neg cardiac work up. Likely due to hypertensive urgency which has resolved.

## 2017-04-09 NOTE — Patient Instructions (Addendum)
Great to see you. We will call you with your lab results and you can view them online.  We are referring you to an endocrinologist as well.  Please continue check your blood pressures at home.

## 2017-04-10 ENCOUNTER — Telehealth: Payer: Self-pay | Admitting: Family Medicine

## 2017-04-10 NOTE — Telephone Encounter (Signed)
Rx printed

## 2017-04-10 NOTE — Telephone Encounter (Signed)
Letter placed in yellow folder for pt's husband to pick up.

## 2017-04-10 NOTE — Telephone Encounter (Signed)
Pt called stating you spoke with her yesterday about working from home for a couple of week.  She needs a letter a stating this Please advise pt when ready for pick

## 2017-04-30 ENCOUNTER — Other Ambulatory Visit: Payer: Self-pay

## 2017-04-30 MED ORDER — CARVEDILOL 6.25 MG PO TABS: 6.2500 mg | ORAL_TABLET | Freq: Two times a day (BID) | ORAL | 0 refills | Status: DC

## 2017-04-30 MED ORDER — AMLODIPINE BESYLATE 10 MG PO TABS: 10.0000 mg | ORAL_TABLET | Freq: Every day | ORAL | 0 refills | Status: DC

## 2017-04-30 NOTE — Telephone Encounter (Signed)
I am not sure who Dr. Eliseo Squires is. Cardiology? Yes I would continue and have her follow up with me.

## 2017-04-30 NOTE — Telephone Encounter (Signed)
Pt was given amlodipine and carvedilol by Dr Eulogio Bear. Pt wants to know if Dr Deborra Medina wants pt to continue taking the amlodipine and carvedilol. Pt request cb. Pt seen 04/09/17.Please advise.

## 2017-04-30 NOTE — Telephone Encounter (Signed)
She said they were given to her in the hospital, f/u scheduled on 05/03/17.

## 2017-05-03 ENCOUNTER — Encounter: Payer: Self-pay | Admitting: Family Medicine

## 2017-05-03 ENCOUNTER — Ambulatory Visit (INDEPENDENT_AMBULATORY_CARE_PROVIDER_SITE_OTHER): Payer: BLUE CROSS/BLUE SHIELD | Admitting: Family Medicine

## 2017-05-03 DIAGNOSIS — I10 Essential (primary) hypertension: Secondary | ICD-10-CM

## 2017-05-03 MED ORDER — CARVEDILOL 6.25 MG PO TABS
6.2500 mg | ORAL_TABLET | Freq: Two times a day (BID) | ORAL | 3 refills | Status: DC
Start: 2017-05-03 — End: 2017-10-24

## 2017-05-03 MED ORDER — AMLODIPINE BESYLATE 10 MG PO TABS: 10.0000 mg | ORAL_TABLET | Freq: Every day | ORAL | 3 refills | Status: DC

## 2017-05-03 NOTE — Assessment & Plan Note (Signed)
BP well controlled on current rxs. eRx refills sent.

## 2017-05-03 NOTE — Progress Notes (Signed)
Subjective:   Patient ID: Kimberly Christian, female    DOB: 03/12/1956, 61 y.o.   MRN: [id number]  Kimberly Christian is a pleasant 61 y.o. year old female who presents to clinic today with blood pressure recheck  on 05/03/2017  HPI:  HTN- was started on amlodipine and coreg in the hospital last month.  I saw her for hospital follow up on 04/09/17.  Note reviewed.  Here today because she was not sure if she needed to continue those medications so we advised her to come in for BP recheck today.  She is feeling much better.  Denies any HA, blurred vision, CP or SOB.  No LE edema.  Current Outpatient Prescriptions on File Prior to Visit  Medication Sig Dispense Refill  . aspirin EC 81 MG EC tablet Take 1 tablet (81 mg total) by mouth daily.    . citalopram (CELEXA) 40 MG tablet TAKE 1 BY MOUTH DAILY (Patient taking differently: Take 40 mg by mouth daily. ) 90 tablet 1  . ibuprofen (ADVIL,MOTRIN) 200 MG tablet Take 800 mg by mouth every 6 (six) hours as needed for mild pain.    . nicotine polacrilex (NICORETTE) 2 MG gum Take 1 each (2 mg total) by mouth as needed for smoking cessation. 100 tablet 0  . simvastatin (ZOCOR) 10 MG tablet TAKE 1 TABLET (10 MG TOTAL) BY MOUTH AT BEDTIME. 90 tablet 0   No current facility-administered medications on file prior to visit.     No Known Allergies  Past Medical History:  Diagnosis Date  . Allergy   . Depression   . Hyperlipidemia     Past Surgical History:  Procedure Laterality Date  . TUBAL LIGATION    . WISDOM TOOTH EXTRACTION      Family History  Problem Relation Age of Onset  . Cancer Mother        lung  . Pulmonary fibrosis Father     Social History   Social History  . Marital status: Single    Spouse name: N/A  . Number of children: N/A  . Years of education: N/A   Occupational History  . Not on file.   Social History Main Topics  . Smoking status: Current Every Day Smoker    Packs/day: 1.00    Years: 20.00    Types:  Cigarettes  . Smokeless tobacco: Never Used  . Alcohol use 4.8 - 6.0 oz/week    8 - 10 Cans of beer per week     Comment: Occasional  . Drug use: No  . Sexual activity: Not on file   Other Topics Concern  . Not on file   Social History Narrative  . No narrative on file   The PMH, PSH, Social History, Family History, Medications, and allergies have been reviewed in [facility], and have been updated if relevant.   Review of Systems  Constitutional: Negative.   Eyes: Negative.   Respiratory: Negative.   Cardiovascular: Negative.   Musculoskeletal: Negative.   Hematological: Negative.   Psychiatric/Behavioral: Negative.   All other systems reviewed and are negative.      Objective:    BP 120/80   Pulse 74   Ht 5\' 1"  (1.549 m)   Wt 195 lb (88.5 kg)   SpO2 99%   BMI 36.84 kg/m    Physical Exam  Constitutional: She is oriented to person, place, and time. She appears well-developed and well-nourished. No distress.  HENT:  Head: Normocephalic and atraumatic.  Eyes:  Conjunctivae are normal.  Cardiovascular: Normal rate and regular rhythm.   Pulmonary/Chest: Effort normal and breath sounds normal.  Musculoskeletal: Normal range of motion. She exhibits no edema.  Neurological: She is alert and oriented to person, place, and time. No cranial nerve deficit.  Skin: Skin is warm and dry. She is not diaphoretic.  Psychiatric: She has a normal mood and affect. Her behavior is normal. Judgment and thought content normal.  Nursing note and vitals reviewed.         Assessment & Plan:   Hypertension, unspecified type No Follow-up on file.

## 2017-05-18 ENCOUNTER — Encounter: Payer: Self-pay | Admitting: Endocrinology

## 2017-05-18 ENCOUNTER — Ambulatory Visit (INDEPENDENT_AMBULATORY_CARE_PROVIDER_SITE_OTHER): Payer: BLUE CROSS/BLUE SHIELD | Admitting: Endocrinology

## 2017-05-18 VITALS — BP 158/92 | HR 83 | Wt 193.8 lb

## 2017-05-18 DIAGNOSIS — R946 Abnormal results of thyroid function studies: Secondary | ICD-10-CM

## 2017-05-18 DIAGNOSIS — I10 Essential (primary) hypertension: Secondary | ICD-10-CM

## 2017-05-18 DIAGNOSIS — E269 Hyperaldosteronism, unspecified: Secondary | ICD-10-CM

## 2017-05-18 DIAGNOSIS — E2689 Other hyperaldosteronism: Secondary | ICD-10-CM | POA: Diagnosis not present

## 2017-05-18 DIAGNOSIS — R7989 Other specified abnormal findings of blood chemistry: Secondary | ICD-10-CM

## 2017-05-18 LAB — BASIC METABOLIC PANEL
BUN: 7 mg/dL (ref 6–23)
CO2: 29 mEq/L (ref 19–32)
Calcium: 9.1 mg/dL (ref 8.4–10.5)
Chloride: 104 mEq/L (ref 96–112)
Creatinine, Ser: 0.76 mg/dL (ref 0.40–1.20)
GFR: 82.65 mL/min (ref >60.00)
Glucose, Bld: 118 mg/dL — ABNORMAL HIGH (ref 70–99)
Potassium: 3.8 mEq/L (ref 3.5–5.1)
Sodium: 141 mEq/L (ref 135–145)

## 2017-05-18 LAB — TSH: TSH: 2.06 u[IU]/mL (ref 0.35–4.50)

## 2017-05-18 LAB — T4, FREE: Free T4: 0.87 ng/dL (ref 0.60–1.60)

## 2017-05-18 NOTE — Patient Instructions (Addendum)
blood tests are requested for you today.  We'll let you know about the results. Let's also check a 24-HR urine test, for "adrenaline." Finally, let's check a test for the blood flow to the kidneys.  you will receive a phone call, about a day and time for an appointment.

## 2017-05-18 NOTE — Progress Notes (Signed)
Subjective:    Patient ID: Kimberly Christian, female    DOB: 05-30-1956, 61 y.o.   MRN: [id number]  HPI Pt was in the hospital 6 weeks ago, with HTN urgency.  She has no prior h/o HTN.  She still has moderate headache, worst at the bitemporal areas, but no assoc paresthesias.  Past Medical History:  Diagnosis Date  . Allergy   . Depression   . Hyperlipidemia     Past Surgical History:  Procedure Laterality Date  . TUBAL LIGATION    . WISDOM TOOTH EXTRACTION      Social History   Social History  . Marital status: Single    Spouse name: N/A  . Number of children: N/A  . Years of education: N/A   Occupational History  . Not on file.   Social History Main Topics  . Smoking status: Current Every Day Smoker    Packs/day: 1.00    Years: 20.00    Types: Cigarettes  . Smokeless tobacco: Never Used  . Alcohol use 4.8 - 6.0 oz/week    8 - 10 Cans of beer per week     Comment: Occasional  . Drug use: No  . Sexual activity: Not on file   Other Topics Concern  . Not on file   Social History Narrative  . No narrative on file    Current Outpatient Prescriptions on File Prior to Visit  Medication Sig Dispense Refill  . amLODipine (NORVASC) 10 MG tablet Take 1 tablet (10 mg total) by mouth daily. 90 tablet 3  . aspirin EC 81 MG EC tablet Take 1 tablet (81 mg total) by mouth daily.    . carvedilol (COREG) 6.25 MG tablet Take 1 tablet (6.25 mg total) by mouth 2 (two) times daily with a meal. 90 tablet 3  . citalopram (CELEXA) 40 MG tablet TAKE 1 BY MOUTH DAILY (Patient taking differently: Take 40 mg by mouth daily. ) 90 tablet 1  . ibuprofen (ADVIL,MOTRIN) 200 MG tablet Take 800 mg by mouth every 6 (six) hours as needed for mild pain.    . nicotine polacrilex (NICORETTE) 2 MG gum Take 1 each (2 mg total) by mouth as needed for smoking cessation. (Patient not taking: Reported on 05/18/2017) 100 tablet 0  . simvastatin (ZOCOR) 10 MG tablet TAKE 1 TABLET (10 MG TOTAL) BY MOUTH AT  BEDTIME. (Patient not taking: Reported on 05/18/2017) 90 tablet 0   No current facility-administered medications on file prior to visit.     No Known Allergies  Family History  Problem Relation Age of Onset  . Cancer Mother        lung  . Pulmonary fibrosis Father     BP (!) 158/92   Pulse 83   Wt 193 lb 12.8 oz (87.9 kg)   SpO2 96%   BMI 36.62 kg/m    Review of Systems Pt denies weight change, blurry vision, sob, diarrhea, n/v, difficulty with concentration, muscle weakness, palpitations, heat intolerance, polyuria, food allergies.  She has fatigue and night sweats.  Chest pain is resolved.  She has lost a few lbs.     Objective:   Physical Exam VS: see vs page GEN: no distress HEAD: head: no deformity eyes: no periorbital swelling, no proptosis external nose and ears are normal mouth: no lesion seen NECK: supple, thyroid is not enlarged CHEST WALL: no deformity LUNGS: clear to auscultation CV: reg rate and rhythm, no murmur ABD: abdomen is soft, nontender.  no hepatosplenomegaly.  not distended.  no hernia MUSCULOSKELETAL: muscle bulk and strength are grossly normal.  no obvious joint swelling.  gait is normal and steady EXTEMITIES: no deformity.  no ulcer on the feet.  feet are of normal color and temp.  1+ bilat leg edema PULSES: dorsalis pedis intact bilat.  no carotid bruit NEURO:  cn 2-12 grossly intact.   readily moves all 4's.  sensation is intact to touch on the feet SKIN:  Normal texture and temperature.  No rash or suspicious lesion is visible.  Not diaphoretic. NODES:  None palpable at the neck PSYCH: alert, well-oriented.  Does not appear anxious nor depressed.   Lab Results  Component Value Date   TSH 2.28 04/09/2017     Lab Results  Component Value Date   CREATININE 0.87 04/09/2017   BUN 10 04/09/2017   NA 136 04/09/2017   K 3.8 04/09/2017   CL 98 04/09/2017   CO2 26 04/09/2017    PRA LC/MS/MS 0.167 - 5.380 ng/mL/hr 6.780      ALDO / PRA  Ratio 0.0 - 30.0 4.2     Aldosterone 0.0 - 30.0 ng/dL 28.3     I have reviewed outside records, and summarized: Pt was noted to have elevated renin, and referred here.  She presented with headaches, pressure sensation in the chest. Cardiac w/u was neg.      Assessment & Plan:  Hyperreninemia, new to me, uncertain etiology.  Aldo is normal HTN, with headache, uncertain if related  Patient Instructions  blood tests are requested for you today.  We'll let you know about the results. Let's also check a 24-HR urine test, for "adrenaline." Finally, let's check a test for the blood flow to the kidneys.  you will receive a phone call, about a day and time for an appointment.

## 2017-05-21 DIAGNOSIS — I10 Essential (primary) hypertension: Secondary | ICD-10-CM | POA: Diagnosis not present

## 2017-05-21 NOTE — Addendum Note (Signed)
Addended by: Kaylyn Lim I on: 05/21/2017 12:16 PM   Modules accepted: Orders

## 2017-05-22 LAB — ALDOSTERONE + RENIN ACTIVITY W/ RATIO
ALDO / PRA Ratio: 1.3 Ratio (ref 0.9–28.9)
Aldosterone: 5 ng/dL
PRA LC/MS/MS: 3.77 ng/mL/h (ref 0.25–5.82)

## 2017-05-28 LAB — CATECHOLAMINES, FRACTIONATED, URINE, 24 HOUR
Calculated Total (E+NE): 56 mcg/24 h (ref 26–121)
Creatinine, Urine mg/day-CATEUR: 0.83 g/(24.h) (ref 0.63–2.50)
Dopamine, 24 hr Urine: 162 mcg/24 h (ref 52–480)
Epinephrine, 24 hr Urine: 9 mcg/24 h (ref 2–24)
Norepinephrine, 24 hr Ur: 47 mcg/24 h (ref 15–100)
Total Volume - CF 24Hr U: 3040 mL

## 2017-05-30 LAB — METANEPHRINES, URINE, 24 HOUR
Metaneph Total, Ur: 687 mcg/24 h (ref 224–832)
Metanephrines, Ur: 276 mcg/24 h (ref 90–315)
Normetanephrine, 24H Ur: 411 mcg/24 h (ref 122–676)

## 2017-06-12 ENCOUNTER — Encounter: Payer: Self-pay | Admitting: Endocrinology

## 2017-06-18 ENCOUNTER — Other Ambulatory Visit: Payer: Self-pay | Admitting: Family Medicine

## 2017-06-27 ENCOUNTER — Encounter: Payer: Self-pay | Admitting: Family Medicine

## 2017-06-27 ENCOUNTER — Ambulatory Visit (INDEPENDENT_AMBULATORY_CARE_PROVIDER_SITE_OTHER): Payer: BLUE CROSS/BLUE SHIELD | Admitting: Family Medicine

## 2017-06-27 ENCOUNTER — Telehealth: Payer: Self-pay

## 2017-06-27 VITALS — BP 120/70 | HR 71 | Ht 61.0 in | Wt 194.0 lb

## 2017-06-27 DIAGNOSIS — I10 Essential (primary) hypertension: Secondary | ICD-10-CM

## 2017-06-27 DIAGNOSIS — Z23 Encounter for immunization: Secondary | ICD-10-CM | POA: Diagnosis not present

## 2017-06-27 MED ORDER — AMLODIPINE BESYLATE 5 MG PO TABS: 5.0000 mg | ORAL_TABLET | Freq: Two times a day (BID) | ORAL | 3 refills | Status: DC

## 2017-06-27 NOTE — Patient Instructions (Signed)
STOP taking norvasc (amlodipine) 10 mg daily. Start taking norvasc 5 mg twice daily.  Continue coreg twice daily.  Keep me updated.

## 2017-06-27 NOTE — Assessment & Plan Note (Signed)
Normotensive here and so I am leery to increase total dosage of rxs she is receiving. Will divide amlodipine to 5 mg twice daily and continue coreg 6.25 mg twice daily. She will update me with BP readings and headache status. The patient indicates understanding of these issues and agrees with the plan.

## 2017-06-27 NOTE — Telephone Encounter (Signed)
Pt called and wanted amlodipine 5 mg sent to [facility] [place]; done and spoke with Cecille Rubin at [facility] [employer] and cancelled amlodipine there. Pt voiced understanding.

## 2017-06-27 NOTE — Progress Notes (Signed)
Subjective:   Patient ID: Kimberly Christian, female    DOB: 1956-08-06, 61 y.o.   MRN: [id number]  Kimberly Christian is a pleasant 61 y.o. year old female who presents to clinic today with Hypertension and Headache  on 06/27/2017  HPI:  HTN- currently taking Norvasc 10 mg and coreg 6.25 mg every morning and Coreg 6.25 mg every evening.  Lately, waking up with early morning headache and BP is elevated into the 170s.  Feels medication is not working through the night.  Feels fine during the day.  Denies blurred vision, CP or SOB. Some LE edema since starting norvasc.  Current Outpatient Prescriptions on File Prior to Visit  Medication Sig Dispense Refill  . amLODipine (NORVASC) 10 MG tablet Take 1 tablet (10 mg total) by mouth daily. 90 tablet 3  . aspirin EC 81 MG EC tablet Take 1 tablet (81 mg total) by mouth daily.    . carvedilol (COREG) 6.25 MG tablet Take 1 tablet (6.25 mg total) by mouth 2 (two) times daily with a meal. 90 tablet 3  . citalopram (CELEXA) 40 MG tablet TAKE 1 TABLET BY MOUTH DAILY 90 tablet 0  . ibuprofen (ADVIL,MOTRIN) 200 MG tablet Take 800 mg by mouth every 6 (six) hours as needed for mild pain.    . nicotine polacrilex (NICORETTE) 2 MG gum Take 1 each (2 mg total) by mouth as needed for smoking cessation. 100 tablet 0  . simvastatin (ZOCOR) 10 MG tablet TAKE 1 TABLET (10 MG TOTAL) BY MOUTH AT BEDTIME. 90 tablet 0   No current facility-administered medications on file prior to visit.     No Known Allergies  Past Medical History:  Diagnosis Date  . Allergy   . Depression   . Hyperlipidemia     Past Surgical History:  Procedure Laterality Date  . TUBAL LIGATION    . WISDOM TOOTH EXTRACTION      Family History  Problem Relation Age of Onset  . Cancer Mother        lung  . Pulmonary fibrosis Father     Social History   Social History  . Marital status: Single    Spouse name: N/A  . Number of children: N/A  . Years of education: N/A    Occupational History  . Not on file.   Social History Main Topics  . Smoking status: Current Every Day Smoker    Packs/day: 1.00    Years: 20.00    Types: Cigarettes  . Smokeless tobacco: Never Used  . Alcohol use 4.8 - 6.0 oz/week    8 - 10 Cans of beer per week     Comment: Occasional  . Drug use: No  . Sexual activity: Not on file   Other Topics Concern  . Not on file   Social History Narrative  . No narrative on file   The PMH, PSH, Social History, Family History, Medications, and allergies have been reviewed in [facility], and have been updated if relevant.   Review of Systems  Constitutional: Negative.   Respiratory: Negative.   Cardiovascular: Positive for leg swelling. Negative for chest pain and palpitations.  Neurological: Positive for headaches. Negative for dizziness, tremors, seizures, syncope, facial asymmetry, speech difficulty, weakness, light-headedness and numbness.  All other systems reviewed and are negative.      Objective:    BP 120/70   Pulse 71   Ht 5\' 1"  (1.549 m)   Wt 194 lb (88 kg)   SpO2  98%   BMI 36.66 kg/m    Physical Exam  Constitutional: She is oriented to person, place, and time. She appears well-developed and well-nourished. No distress.  HENT:  Head: Normocephalic and atraumatic.  Eyes: Conjunctivae are normal.  Cardiovascular: Normal rate.   Pulmonary/Chest: Effort normal.  Musculoskeletal: Normal range of motion.  Neurological: She is alert and oriented to person, place, and time. No cranial nerve deficit.  Skin: Skin is warm and dry. She is not diaphoretic.  Psychiatric: She has a normal mood and affect. Her behavior is normal. Judgment and thought content normal.  Nursing note and vitals reviewed.         Assessment & Plan:   Hypertension, unspecified type No Follow-up on file.

## 2017-07-12 ENCOUNTER — Encounter: Payer: Self-pay | Admitting: Family Medicine

## 2017-09-20 ENCOUNTER — Other Ambulatory Visit: Payer: Self-pay | Admitting: Family Medicine

## 2017-10-24 ENCOUNTER — Other Ambulatory Visit: Payer: Self-pay | Admitting: Family Medicine

## 2017-11-06 ENCOUNTER — Encounter: Payer: Self-pay | Admitting: Family Medicine

## 2017-12-17 ENCOUNTER — Other Ambulatory Visit: Payer: Self-pay | Admitting: Family Medicine

## 2018-01-29 ENCOUNTER — Other Ambulatory Visit: Payer: Self-pay | Admitting: Family Medicine

## 2018-02-19 ENCOUNTER — Other Ambulatory Visit: Payer: Self-pay | Admitting: Family Medicine

## 2018-03-05 ENCOUNTER — Other Ambulatory Visit: Payer: Self-pay

## 2018-03-05 ENCOUNTER — Ambulatory Visit (INDEPENDENT_AMBULATORY_CARE_PROVIDER_SITE_OTHER): Payer: BLUE CROSS/BLUE SHIELD | Admitting: Family Medicine

## 2018-03-05 ENCOUNTER — Other Ambulatory Visit (HOSPITAL_COMMUNITY)
Admission: RE | Admit: 2018-03-05 | Discharge: 2018-03-05 | Disposition: A | Discharge status: Home / Self Care | Payer: BLUE CROSS/BLUE SHIELD | Source: Ambulatory Visit | Attending: Family Medicine | Admitting: Family Medicine

## 2018-03-05 VITALS — BP 132/88 | HR 93 | Temp 99.3°F | Ht 61.0 in | Wt 195.0 lb

## 2018-03-05 DIAGNOSIS — Z1231 Encounter for screening mammogram for malignant neoplasm of breast: Secondary | ICD-10-CM | POA: Diagnosis not present

## 2018-03-05 DIAGNOSIS — Z1151 Encounter for screening for human papillomavirus (HPV): Secondary | ICD-10-CM | POA: Diagnosis not present

## 2018-03-05 DIAGNOSIS — Z1239 Encounter for other screening for malignant neoplasm of breast: Secondary | ICD-10-CM

## 2018-03-05 DIAGNOSIS — E7800 Pure hypercholesterolemia, unspecified: Secondary | ICD-10-CM

## 2018-03-05 DIAGNOSIS — F32A Depression, unspecified: Secondary | ICD-10-CM

## 2018-03-05 DIAGNOSIS — N898 Other specified noninflammatory disorders of vagina: Secondary | ICD-10-CM

## 2018-03-05 DIAGNOSIS — Z01419 Encounter for gynecological examination (general) (routine) without abnormal findings: Secondary | ICD-10-CM

## 2018-03-05 DIAGNOSIS — I10 Essential (primary) hypertension: Secondary | ICD-10-CM

## 2018-03-05 DIAGNOSIS — F329 Major depressive disorder, single episode, unspecified: Secondary | ICD-10-CM | POA: Diagnosis not present

## 2018-03-05 DIAGNOSIS — Z01411 Encounter for gynecological examination (general) (routine) with abnormal findings: Secondary | ICD-10-CM | POA: Diagnosis not present

## 2018-03-05 DIAGNOSIS — E269 Hyperaldosteronism, unspecified: Secondary | ICD-10-CM

## 2018-03-05 DIAGNOSIS — E2689 Other hyperaldosteronism: Secondary | ICD-10-CM | POA: Diagnosis not present

## 2018-03-05 LAB — COMPREHENSIVE METABOLIC PANEL
ALT: 17 U/L (ref 0–35)
AST: 24 U/L (ref 0–37)
Albumin: 4 g/dL (ref 3.5–5.2)
Alkaline Phosphatase: 80 U/L (ref 39–117)
BUN: 8 mg/dL (ref 6–23)
CO2: 25 mEq/L (ref 19–32)
Calcium: 9.2 mg/dL (ref 8.4–10.5)
Chloride: 102 mEq/L (ref 96–112)
Creatinine, Ser: 0.8 mg/dL (ref 0.40–1.20)
GFR: 77.69 mL/min (ref >60.00)
Glucose, Bld: 95 mg/dL (ref 70–99)
Potassium: 4.3 mEq/L (ref 3.5–5.1)
Sodium: 138 mEq/L (ref 135–145)
Total Bilirubin: 0.4 mg/dL (ref 0.2–1.2)
Total Protein: 7.3 g/dL (ref 6.0–8.3)

## 2018-03-05 LAB — CBC WITH DIFFERENTIAL/PLATELET
Basophils Absolute: 0.1 10*3/uL (ref 0.0–0.1)
Basophils Relative: 0.8 % (ref 0.0–3.0)
Eosinophils Absolute: 0.1 10*3/uL (ref 0.0–0.7)
Eosinophils Relative: 1.6 % (ref 0.0–5.0)
HCT: 44.5 % (ref 36.0–46.0)
Hemoglobin: 15.3 g/dL — ABNORMAL HIGH (ref 12.0–15.0)
Lymphocytes Relative: 21.9 % (ref 12.0–46.0)
Lymphs Abs: 1.8 10*3/uL (ref 0.7–4.0)
MCHC: 34.4 g/dL (ref 30.0–36.0)
MCV: 100 fl (ref 78.0–100.0)
Monocytes Absolute: 0.6 10*3/uL (ref 0.1–1.0)
Monocytes Relative: 7.1 % (ref 3.0–12.0)
Neutro Abs: 5.6 10*3/uL (ref 1.4–7.7)
Neutrophils Relative %: 68.6 % (ref 43.0–77.0)
Platelets: 245 10*3/uL (ref 150.0–400.0)
RBC: 4.45 Mil/uL (ref 3.87–5.11)
RDW: 13.2 % (ref 11.5–15.5)
WBC: 8.2 10*3/uL (ref 4.0–10.5)

## 2018-03-05 LAB — TSH: TSH: 2.2 u[IU]/mL (ref 0.35–4.50)

## 2018-03-05 LAB — LIPID PANEL
Cholesterol: 211 mg/dL — ABNORMAL HIGH (ref 0–200)
HDL: 52.9 mg/dL (ref >39.00)
LDL Cholesterol: 141 mg/dL — ABNORMAL HIGH (ref 0–99)
NonHDL: 158.24
Total CHOL/HDL Ratio: 4
Triglycerides: 84 mg/dL (ref 0.0–149.0)
VLDL: 16.8 mg/dL (ref 0.0–40.0)

## 2018-03-05 MED ORDER — AMLODIPINE BESYLATE 5 MG PO TABS: 5.0000 mg | ORAL_TABLET | Freq: Two times a day (BID) | ORAL | 2 refills | Status: DC

## 2018-03-05 MED ORDER — CARVEDILOL 6.25 MG PO TABS: 6.2500 mg | ORAL_TABLET | Freq: Two times a day (BID) | ORAL | 2 refills | Status: DC

## 2018-03-05 MED ORDER — CITALOPRAM HYDROBROMIDE 40 MG PO TABS: 40.0000 mg | ORAL_TABLET | Freq: Every day | ORAL | 2 refills | Status: DC

## 2018-03-05 NOTE — Patient Instructions (Signed)
Great to see you. I will call you with your lab results from today and you can view them online.   Please call the breast center at [phone] to schedule your mammogram.  

## 2018-03-05 NOTE — Assessment & Plan Note (Signed)
Discussed possible irritation from poise pads. Add wet prep to pap smear.

## 2018-03-05 NOTE — Assessment & Plan Note (Signed)
Reviewed preventive care protocols, scheduled due services, and updated immunizations Discussed nutrition, exercise, diet, and healthy lifestyle.  Orders Placed This Encounter  Procedures  . MM Digital Screening  . CBC with Differential/Platelet  . Comprehensive metabolic panel  . Lipid panel  . TSH    

## 2018-03-05 NOTE — Telephone Encounter (Signed)
Sent in refills/thx dmf

## 2018-03-05 NOTE — Assessment & Plan Note (Signed)
Check fasting labs today. She is willing to start another rx based on these results.

## 2018-03-05 NOTE — Progress Notes (Signed)
Subjective:   Patient ID: Kimberly Christian, female    DOB: 1956-04-16, 62 y.o.   MRN: [id number]  Kimberly Christian is a pleasant 62 y.o. year old female who presents to clinic today with Annual Exam (Patient is here today for a CPE with PAP.  She is currently fasting.  She will get Mammogram at [place] and will call after order is created.  Agrees to [employer].  She stopped Zocor a year ago due to the severe leg pain it caused but would be willing to take something different.)  on 03/05/2018  HPI:  Patient is here today for a CPE with PAP. She is currently fasting. She will get Mammogram at [place] and will call after order is created. Agrees to [employer]. She stopped Zocor a year ago due to the severe leg pain it caused but would be willing to take something different.  Does have intermittent vaginal discharge and itching. Health Maintenance  Topic Date Due  . MAMMOGRAM  02/26/2016  . COLONOSCOPY  04/24/2016  . PAP SMEAR  04/16/2017  . INFLUENZA VACCINE  04/25/2018  . TETANUS/TDAP  12/26/2021  . Hepatitis C Screening  Completed  . HIV Screening  Completed    HLD- stopped zocor a year ago due to leg pain. She is willing to try another statin based on her labs today.  Depression- feeling celexa is working as well as it can.  Her husband is still sick- has lung cancer.  BP is controlled with coreg. Current Outpatient Medications on File Prior to Visit  Medication Sig Dispense Refill  . aspirin EC 81 MG EC tablet Take 1 tablet (81 mg total) by mouth daily.     No current facility-administered medications on file prior to visit.     Allergies  Allergen Reactions  . Statins     Severe leg pain    Past Medical History:  Diagnosis Date  . Allergy   . Depression   . Hyperlipidemia     Past Surgical History:  Procedure Laterality Date  . TUBAL LIGATION    . WISDOM TOOTH EXTRACTION      Family History  Problem Relation  Age of Onset  . Cancer Mother        lung  . Pulmonary fibrosis Father     Social History   Socioeconomic History  . Marital status: Single    Spouse name: Not on file  . Number of children: Not on file  . Years of education: Not on file  . Highest education level: Not on file  Occupational History  . Not on file  Social Needs  . Financial resource strain: Not on file  . Food insecurity:    Worry: Not on file    Inability: Not on file  . Transportation needs:    Medical: Not on file    Non-medical: Not on file  Tobacco Use  . Smoking status: Current Every Day Smoker    Packs/day: 1.00    Years: 20.00    Pack years: 20.00    Types: Cigarettes  . Smokeless tobacco: Never Used  Substance and Sexual Activity  . Alcohol use: Yes    Alcohol/week: 4.8 - 6.0 oz    Types: 8 - 10 Cans of beer per week    Comment: Occasional  . Drug use: No  . Sexual activity: Not on file  Lifestyle  . Physical activity:    Days per week: Not  on file    Minutes per session: Not on file  . Stress: Not on file  Relationships  . Social connections:    Talks on phone: Not on file    Gets together: Not on file    Attends religious service: Not on file    Active member of club or organization: Not on file    Attends meetings of clubs or organizations: Not on file    Relationship status: Not on file  . Intimate partner violence:    Fear of current or ex partner: Not on file    Emotionally abused: Not on file    Physically abused: Not on file    Forced sexual activity: Not on file  Other Topics Concern  . Not on file  Social History Narrative  . Not on file   The PMH, PSH, Social History, Family History, Medications, and allergies have been reviewed in [facility], and have been updated if relevant.   Review of Systems  Constitutional: Negative.   HENT: Negative.   Eyes: Negative.   Respiratory: Negative.   Cardiovascular: Negative.   Gastrointestinal: Negative.   Endocrine: Negative.     Genitourinary: Positive for vaginal discharge. Negative for decreased urine volume, urgency, vaginal bleeding and vaginal pain.  Musculoskeletal: Negative.   Allergic/Immunologic: Negative.   Neurological: Negative.   Hematological: Negative.   Psychiatric/Behavioral: Negative.   All other systems reviewed and are negative.      Objective:    BP 132/88 (BP Location: Left Arm, Patient Position: Sitting, Cuff Size: Normal)   Pulse 93   Temp 99.3 F (37.4 C) (Oral)   Ht 5\' 1"  (1.549 m)   Wt 195 lb (88.5 kg)   SpO2 99%   BMI 36.84 kg/m    Physical Exam   General:  Well-developed,well-nourished,in no acute distress; alert,appropriate and cooperative throughout examination Head:  normocephalic and atraumatic.   Eyes:  vision grossly intact, PERRL Ears:  R ear normal and L ear normal externally, TMs clear bilaterally Nose:  no external deformity.   Mouth:  good dentition.   Neck:  No deformities, masses, or tenderness noted. Breasts:  No mass, nodules, thickening, tenderness, bulging, retraction, inflamation, nipple discharge or skin changes noted.   Lungs:  Normal respiratory effort, chest expands symmetrically. Lungs are clear to auscultation, no crackles or wheezes. Heart:  Normal rate and regular rhythm. S1 and S2 normal without gallop, murmur, click, rub or other extra sounds. Abdomen:  Bowel sounds positive,abdomen soft and non-tender without masses, organomegaly or hernias noted. Rectal:  no external abnormalities.   Genitalia:  Pelvic Exam:        External: normal female genitalia without lesions or masses        Vagina: normal without lesions or masses        Cervix: normal without lesions or masses        Adnexa: normal bimanual exam without masses or fullness        Uterus: normal by palpation        Pap smear: performed Msk:  No deformity or scoliosis noted of thoracic or lumbar spine.   Extremities:  No clubbing, cyanosis, edema, or deformity noted with normal  full range of motion of all joints.   Neurologic:  alert & oriented X3 and gait normal.   Skin:  Intact without suspicious lesions or rashes Cervical Nodes:  No lymphadenopathy noted Axillary Nodes:  No palpable lymphadenopathy Psych:  Cognition and judgment appear intact. Alert and cooperative with normal  attention span and concentration. No apparent delusions, illusions, hallucinations       Assessment & Plan:   Well woman exam with routine gynecological exam - Plan: Cytology - PAP  Vaginal discharge - Plan: Cytology - PAP No follow-ups on file.

## 2018-03-05 NOTE — Assessment & Plan Note (Signed)
Feels controlled as well as it can be celexa.

## 2018-03-07 ENCOUNTER — Telehealth: Payer: Self-pay

## 2018-03-07 ENCOUNTER — Other Ambulatory Visit: Payer: Self-pay | Admitting: Family Medicine

## 2018-03-07 ENCOUNTER — Encounter: Payer: Self-pay | Admitting: Family Medicine

## 2018-03-07 DIAGNOSIS — E7849 Other hyperlipidemia: Secondary | ICD-10-CM

## 2018-03-07 LAB — CYTOLOGY - PAP
Adequacy: ABSENT
Bacterial vaginitis: NEGATIVE
Candida vaginitis: NEGATIVE
Diagnosis: NEGATIVE
HPV 16/18/45 genotyping: NEGATIVE
HPV: DETECTED — AB

## 2018-03-07 MED ORDER — PRAVASTATIN SODIUM 10 MG PO TABS: 10.0000 mg | ORAL_TABLET | Freq: Every day | ORAL | 3 refills | Status: DC

## 2018-03-07 NOTE — Telephone Encounter (Signed)
PT aware/agrees and will start the Pravachol/she will call back to sched fasting Lipid & CMP in 8 weeks to ensure efficacy/created future order/thx dmf

## 2018-03-07 NOTE — Telephone Encounter (Signed)
-----   Message from Lucille Passy, MD sent at 03/05/2018  4:59 PM EDT ----- Please let pt know that her cbc, thyroid function, liver function, kidney function and electrolytes look excellent. Keep up the good work. Cholesterol is up like we suspected.  If she is okay with trying pravachol rather than zocor- please send in rx for pravachol 10 mg nightly, #30 with 3 refills and schedule follow up lipid panel and CMET in 8 weeks. Thank you.

## 2018-04-23 ENCOUNTER — Ambulatory Visit
Admission: RE | Admit: 2018-04-23 | Discharge: 2018-04-23 | Disposition: A | Discharge status: Home / Self Care | Payer: BLUE CROSS/BLUE SHIELD | Source: Ambulatory Visit | Attending: Family Medicine | Admitting: Family Medicine

## 2018-04-23 DIAGNOSIS — Z1239 Encounter for other screening for malignant neoplasm of breast: Secondary | ICD-10-CM

## 2018-04-23 DIAGNOSIS — Z1231 Encounter for screening mammogram for malignant neoplasm of breast: Secondary | ICD-10-CM | POA: Diagnosis not present

## 2018-04-25 ENCOUNTER — Other Ambulatory Visit: Payer: Self-pay | Admitting: Family Medicine

## 2018-04-25 DIAGNOSIS — R928 Other abnormal and inconclusive findings on diagnostic imaging of breast: Secondary | ICD-10-CM

## 2018-04-30 ENCOUNTER — Ambulatory Visit: Payer: BLUE CROSS/BLUE SHIELD

## 2018-04-30 ENCOUNTER — Ambulatory Visit
Admission: RE | Admit: 2018-04-30 | Discharge: 2018-04-30 | Disposition: A | Discharge status: Home / Self Care | Payer: BLUE CROSS/BLUE SHIELD | Source: Ambulatory Visit | Attending: Family Medicine | Admitting: Family Medicine

## 2018-04-30 DIAGNOSIS — R928 Other abnormal and inconclusive findings on diagnostic imaging of breast: Secondary | ICD-10-CM

## 2018-05-06 ENCOUNTER — Telehealth: Payer: Self-pay

## 2018-05-06 NOTE — Telephone Encounter (Signed)
I spoke to pt/she states that she has not gotten anything for a Cologuard from [employer]/I gave her the number and asked that she call them and state that her information was faxed to them on 6.11.19 and you have been waiting to hear from them/thx dmf

## 2018-07-07 ENCOUNTER — Other Ambulatory Visit: Payer: Self-pay | Admitting: Family Medicine

## 2018-09-13 ENCOUNTER — Other Ambulatory Visit: Payer: Self-pay | Admitting: Family Medicine

## 2018-11-22 ENCOUNTER — Other Ambulatory Visit: Payer: Self-pay | Admitting: Family Medicine

## 2018-12-01 ENCOUNTER — Other Ambulatory Visit: Payer: Self-pay | Admitting: Family Medicine

## 2018-12-25 ENCOUNTER — Other Ambulatory Visit: Payer: Self-pay | Admitting: Family Medicine

## 2019-01-15 ENCOUNTER — Other Ambulatory Visit: Payer: Self-pay | Admitting: Family Medicine

## 2019-01-16 NOTE — Telephone Encounter (Signed)
Pt has not been seen in over 6 mo. Pt needs schedule OV.

## 2019-01-19 NOTE — Progress Notes (Signed)
Virtual Visit via Video   Due to the COVID-19 pandemic, this visit was completed with telemedicine (audio/video) technology to reduce patient and provider exposure as well as to preserve personal protective equipment.   I connected with Kimberly Christian on 01/20/19 at  3:20 PM EDT by a video enabled telemedicine application and verified that I am speaking with the correct person using two identifiers. Location patient: Home Location provider: [facility], Office Persons participating in the virtual visit: Kimberly Christian, Kimberly Norris, MD   I discussed the limitations of evaluation and management by telemedicine and the availability of in person appointments. The patient expressed understanding and agreed to proceed.  Care Team   Patient Care Team: Kimberly Passy, MD as PCP - General (Family Medicine)  Subjective:   HPI:   Follow up medications.   Last saw patient on 03/05/18.  Note reviewed.  At that time, BP was controlled with coreg and norvasc 5 mg daily. Before taking her medication this morning, her BP was 164/84.  Denies HA, blurred vision CP or SOB.  It is now down to 140/67.     BP Readings from Last 3 Encounters:  01/20/19 140/67  03/05/18 132/88  06/27/17 120/70    Lab Results  Component Value Date   CREATININE 0.80 03/05/2018    HLD- she did restart taking statin. Currently taking pravachol 10 mg daily.  It is not causing myalgias like previous statins did. She is due for labs.  Lab Results  Component Value Date   CHOL 211 (H) 03/05/2018   HDL 52.90 03/05/2018   LDLCALC 141 (H) 03/05/2018   LDLDIRECT 143.8 04/10/2013   TRIG 84.0 03/05/2018   CHOLHDL 4 03/05/2018   Lab Results  Component Value Date   ALT 17 03/05/2018   AST 24 03/05/2018   ALKPHOS 80 03/05/2018   BILITOT 0.4 03/05/2018   Depression- she does feel it is under control on current dose of celexa- 40 mg daily despite her husband's terminal lung cancer.  She says she is coping well.   Depression screen PHQ 2/9 05/03/2017  Decreased Interest 0  Down, Depressed, Hopeless 0  PHQ - 2 Score 0   No flowsheet data found.     Review of Systems  Constitutional: Negative.   HENT: Negative.   Eyes: Negative.   Respiratory: Negative.   Cardiovascular: Negative.   Gastrointestinal: Negative.   Genitourinary: Negative.   Musculoskeletal: Negative.   Skin: Negative.   Neurological: Negative.   Endo/Heme/Allergies: Negative.   Psychiatric/Behavioral: Negative.   All other systems reviewed and are negative.    Patient Active Problem List   Diagnosis Date Noted  . HTN (hypertension) 05/03/2017  . 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 Elevated TSH 04/09/2017  . Incidental lung nodule 04/02/2017  . Depression 05/26/2015  . Obesity 04/16/2014  . Hyperglycemia 04/16/2014  . HLD (hyperlipidemia) 02/13/2013  . TOBACCO ABUSE 11/22/2010  . ALLERGIC RHINITIS 11/22/2010    Social History   Tobacco Use  . Smoking status: Current Every Day Smoker    Packs/day: 1.00    Years: 20.00    Pack years: 20.00    Types: Cigarettes  . Smokeless tobacco: Never Used  Substance Use Topics  . Alcohol use: Yes    Alcohol/week: 8.0 - 10.0 standard drinks    Types: 8 - 10 Cans of beer per week    Comment: Occasional    Current Outpatient Medications:  .  amLODipine (NORVASC) 5 MG  tablet, TAKE 1 TABLET(5 MG) BY MOUTH TWICE DAILY, Disp: 30 tablet, Rfl: 0 .  carvedilol (COREG) 6.25 MG tablet, TAKE 1 TABLET BY MOUTH TWICE DAILY WITH A MEAL, Disp: 180 tablet, Rfl: 2 .  citalopram (CELEXA) 40 MG tablet, TAKE 1 TABLET(40 MG) BY MOUTH DAILY, Disp: 90 tablet, Rfl: 0 .  pravastatin (PRAVACHOL) 10 MG tablet, TAKE 1 TABLET(10 MG) BY MOUTH DAILY, Disp: 30 tablet, Rfl: 1  Allergies  Allergen Reactions  . Statins     Severe leg pain    Objective:  BP 140/67 Comment: pt reported taking before BP meds  Pulse 70 Comment: pt reported  Ht 5\' 1"  (1.549 m)   Wt 190 lb (86.2 kg)    BMI 35.90 kg/m   VITALS: Per patient if applicable, see vitals. GENERAL: Alert, appears well and in no acute distress. HEENT: Atraumatic, conjunctiva clear, no obvious abnormalities on inspection of external nose and ears. NECK: Normal movements of the head and neck. CARDIOPULMONARY: No increased WOB. Speaking in clear sentences. I:E ratio WNL.  MS: Moves all visible extremities without noticeable abnormality. PSYCH: Pleasant and cooperative, well-groomed. Speech normal rate and rhythm. Affect is appropriate. Insight and judgement are appropriate. Attention is focused, linear, and appropriate.  NEURO: CN grossly intact. Oriented as arrived to appointment on time with no prompting. Moves both UE equally.  SKIN: No obvious lesions, wounds, erythema, or cyanosis noted on face or hands.  Depression screen PHQ 2/9 05/03/2017  Decreased Interest 0  Down, Depressed, Hopeless 0  PHQ - 2 Score 0    Assessment and Plan:   Kimberly Christian was seen today for follow-up.  Diagnoses and all orders for this visit:  Hypertension, unspecified type  Pure hypercholesterolemia  Depression, unspecified depression type    . COVID-19 Education:The signs and symptoms of COVID-19 were discussed with the patient and how to seek care for testing if needed. The importance of social distancing was discussed today. . Reviewed expectations re: course of current medical issues. . Discussed self-management of symptoms. . Outlined signs and symptoms indicating need for more acute intervention. . Patient verbalized understanding and all questions were answered. Marland Kitchen Health Maintenance issues including appropriate healthy diet, exercise, and smoking avoidance were discussed with patient. . See orders for this visit as documented in the electronic medical record.  Kimberly Norris, MD 01/20/2019  Records requested if needed. Time spent: 25 minutes, of which >50% was spent in obtaining information about her symptoms, reviewing  her previous labs, evaluations, and treatments, counseling her about her condition (please see the discussed topics above), and developing a plan to further investigate it; she had a number of questions which I addressed.

## 2019-01-20 ENCOUNTER — Encounter: Payer: Self-pay | Admitting: Family Medicine

## 2019-01-20 ENCOUNTER — Ambulatory Visit (INDEPENDENT_AMBULATORY_CARE_PROVIDER_SITE_OTHER): Payer: BLUE CROSS/BLUE SHIELD | Admitting: Family Medicine

## 2019-01-20 VITALS — BP 140/67 | HR 70 | Ht 61.0 in | Wt 190.0 lb

## 2019-01-20 DIAGNOSIS — I10 Essential (primary) hypertension: Secondary | ICD-10-CM | POA: Diagnosis not present

## 2019-01-20 DIAGNOSIS — E7800 Pure hypercholesterolemia, unspecified: Secondary | ICD-10-CM | POA: Diagnosis not present

## 2019-01-20 DIAGNOSIS — F32A Depression, unspecified: Secondary | ICD-10-CM

## 2019-01-20 DIAGNOSIS — F329 Major depressive disorder, single episode, unspecified: Secondary | ICD-10-CM

## 2019-01-20 MED ORDER — CARVEDILOL 6.25 MG PO TABS: ORAL_TABLET | ORAL | 2 refills | Status: DC

## 2019-01-20 MED ORDER — PRAVASTATIN SODIUM 10 MG PO TABS: ORAL_TABLET | ORAL | 3 refills | Status: DC

## 2019-01-20 MED ORDER — AMLODIPINE BESYLATE 5 MG PO TABS: ORAL_TABLET | ORAL | 3 refills | Status: DC

## 2019-01-20 MED ORDER — CITALOPRAM HYDROBROMIDE 40 MG PO TABS: ORAL_TABLET | ORAL | 3 refills | Status: DC

## 2019-01-20 NOTE — Assessment & Plan Note (Signed)
Well controlled on current doses of norvasc and coreg.  eRx refills sent. The patient indicates understanding of these issues and agrees with the plan.

## 2019-01-20 NOTE — Assessment & Plan Note (Signed)
Feels celexa is working well at current dose even with increased stressors. eRx refills sent to pharmacy on file. The patient indicates understanding of these issues and agrees with the plan.

## 2019-01-20 NOTE — Assessment & Plan Note (Signed)
She has been compliant with pravastatin.  She is due for labs- she will come in a couple of months to have them drawn. eRx refills sent.

## 2019-02-27 DIAGNOSIS — L700 Acne vulgaris: Secondary | ICD-10-CM | POA: Diagnosis not present

## 2019-04-10 DIAGNOSIS — L700 Acne vulgaris: Secondary | ICD-10-CM | POA: Diagnosis not present

## 2019-04-10 DIAGNOSIS — L720 Epidermal cyst: Secondary | ICD-10-CM | POA: Diagnosis not present

## 2019-04-24 DIAGNOSIS — L02213 Cutaneous abscess of chest wall: Secondary | ICD-10-CM | POA: Diagnosis not present

## 2019-05-08 DIAGNOSIS — L700 Acne vulgaris: Secondary | ICD-10-CM | POA: Diagnosis not present

## 2019-07-17 ENCOUNTER — Other Ambulatory Visit (INDEPENDENT_AMBULATORY_CARE_PROVIDER_SITE_OTHER): Payer: BC Managed Care – PPO

## 2019-07-17 ENCOUNTER — Ambulatory Visit (INDEPENDENT_AMBULATORY_CARE_PROVIDER_SITE_OTHER): Payer: BC Managed Care – PPO | Admitting: Family Medicine

## 2019-07-17 ENCOUNTER — Encounter: Payer: Self-pay | Admitting: Family Medicine

## 2019-07-17 ENCOUNTER — Other Ambulatory Visit: Payer: Self-pay

## 2019-07-17 DIAGNOSIS — R350 Frequency of micturition: Secondary | ICD-10-CM

## 2019-07-17 DIAGNOSIS — E269 Hyperaldosteronism, unspecified: Secondary | ICD-10-CM | POA: Diagnosis not present

## 2019-07-17 DIAGNOSIS — R300 Dysuria: Secondary | ICD-10-CM

## 2019-07-17 LAB — URINALYSIS, ROUTINE W REFLEX MICROSCOPIC
Bilirubin Urine: NEGATIVE
Ketones, ur: NEGATIVE
Nitrite: NEGATIVE
Specific Gravity, Urine: 1.01 (ref 1.000–1.030)
Total Protein, Urine: 30 — AB
Urine Glucose: NEGATIVE
Urobilinogen, UA: 0.2 (ref 0.0–1.0)
pH: 7 (ref 5.0–8.0)

## 2019-07-17 MED ORDER — CEPHALEXIN 500 MG PO CAPS: 500.0000 mg | ORAL_CAPSULE | Freq: Two times a day (BID) | ORAL | 0 refills | Status: DC

## 2019-07-17 MED ORDER — PHENAZOPYRIDINE HCL 200 MG PO TABS: 200.0000 mg | ORAL_TABLET | Freq: Three times a day (TID) | ORAL | 0 refills | Status: DC | PRN

## 2019-07-17 NOTE — Progress Notes (Signed)
   TELEPHONE ENCOUNTER   Patient verbally agreed to telephone visit and is aware that copayment and coinsurance may apply. Patient was treated using telemedicine according to accepted telemedicine protocols.  Location of the patient: patient's home Location of provider: provider's home Names of all persons participating in the telemedicine service and role in the encounter: Arnette Norris, MD Liam Rogers  Subjective:   Chief Complaint  Patient presents with  . Dysuria    Pt agrees to virtual visit. Pt is C/O dysuria on Monday.  She tried flushing with H2O but this morning it returned worse than before. There is a tinge of pink on napkin after she wipes. Painful in pelvis when urinating. Denies flank pain or fever.     HPI   Dysuria- started Monday. She tried flushing by pushing fluids but this morning it returned worse than before. There is a tinge of pink on napkin after she wipes. Painful in pelvis when urinating. Denies flank pain or fever.  Patient Active Problem List   Diagnosis Date Noted  . HTN (hypertension) 05/03/2017  . 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 Elevated TSH 04/09/2017  . Incidental lung nodule 04/02/2017  . Depression 05/26/2015  . Obesity 04/16/2014  . Hyperglycemia 04/16/2014  . HLD (hyperlipidemia) 02/13/2013  . TOBACCO ABUSE 11/22/2010  . ALLERGIC RHINITIS 11/22/2010   Social History   Tobacco Use  . Smoking status: Current Every Day Smoker    Packs/day: 1.00    Years: 20.00    Pack years: 20.00    Types: Cigarettes  . Smokeless tobacco: Never Used  Substance Use Topics  . Alcohol use: Yes    Alcohol/week: 8.0 - 10.0 standard drinks    Types: 8 - 10 Cans of beer per week    Comment: Occasional    Current Outpatient Medications:  .  amLODipine (NORVASC) 5 MG tablet, TAKE 1 TABLET(5 MG) BY MOUTH TWICE DAILY, Disp: 90 tablet, Rfl: 3 .  carvedilol (COREG) 6.25 MG tablet, TAKE 1 TABLET BY MOUTH TWICE DAILY WITH A MEAL, Disp: 180  tablet, Rfl: 2 .  citalopram (CELEXA) 40 MG tablet, TAKE 1 TABLET(40 MG) BY MOUTH DAILY, Disp: 90 tablet, Rfl: 3 .  pravastatin (PRAVACHOL) 10 MG tablet, TAKE 1 TABLET(10 MG) BY MOUTH DAILY, Disp: 90 tablet, Rfl: 3 Allergies  Allergen Reactions  . Statins     Severe leg pain    Assessment & Plan:   1. Dysuria   2. Urinary frequency     Orders Placed This Encounter  Procedures  . Urine Culture  . Urinalysis, Routine w reflex microscopic   No orders of the defined types were placed in this encounter.   Arnette Norris, MD 07/17/2019  Time spent with the patient: 11 minutes, spent in obtaining information about her symptoms, reviewing her previous labs, evaluations, and treatments, counseling her about her condition (please see the discussed topics above), and developing a plan to further investigate it; she had a number of questions which I addressed.     [id number] physician/qualified health professional telephone evaluation 5 to 10 minutes [id number] physician/qualified help functional [place] evaluation for 11 to 20 minutes [id number] physician/qualify he will professional telephone evaluation for 21 to 30 minutes

## 2019-07-17 NOTE — Assessment & Plan Note (Signed)
New- no red flag symptoms.  We have scheduled her for   12:30 today and have placed the future orders for urine Cx and U/A with reflex micro.   Given her progressive symptoms, I have called in both Keflex 500 mg twice daily x 7 days and pyridium for pain but advised to NOT start them until after she leaves her urine sample. The patient indicates understanding of these issues and agrees with the plan. Orders Placed This Encounter  Procedures  . Urine Culture  . Urinalysis, Routine w reflex microscopic

## 2019-07-19 ENCOUNTER — Encounter: Payer: Self-pay | Admitting: Family Medicine

## 2019-07-19 LAB — URINE CULTURE
MICRO NUMBER:: 1018932
SPECIMEN QUALITY:: ADEQUATE

## 2019-08-18 IMAGING — MG DIGITAL SCREENING BILATERAL MAMMOGRAM WITH TOMO AND CAD
1 series · 1 of 5 positions shown · non-contrast
Comparison: Previous exam(s).

ADDENDUM:
The patient returned today after being called back from screening
mammography for a small mass in the lower far posterior right
breast. Prior to performing diagnostic imaging the technologist
noted a skin tag in the right inframammary fold that was felt to
correspond with this initially questioned right breast mass.

Additional CC tomograms were performed of the right breast with a
skin marker placed at the site of the skin tag in the inframammary
fold. This corresponds with the initially questioned right breast
mass and therefore no further imaging was necessary. These findings
were discussed with the patient.
Recommend annual routine screening mammography, due March 2019.
BI-RADS category: 2: Benign.
CLINICAL DATA: Screening.
EXAM:
DIGITAL SCREENING BILATERAL MAMMOGRAM WITH TOMO AND CAD

[R CC tomo · tomo slice 31/60.0]
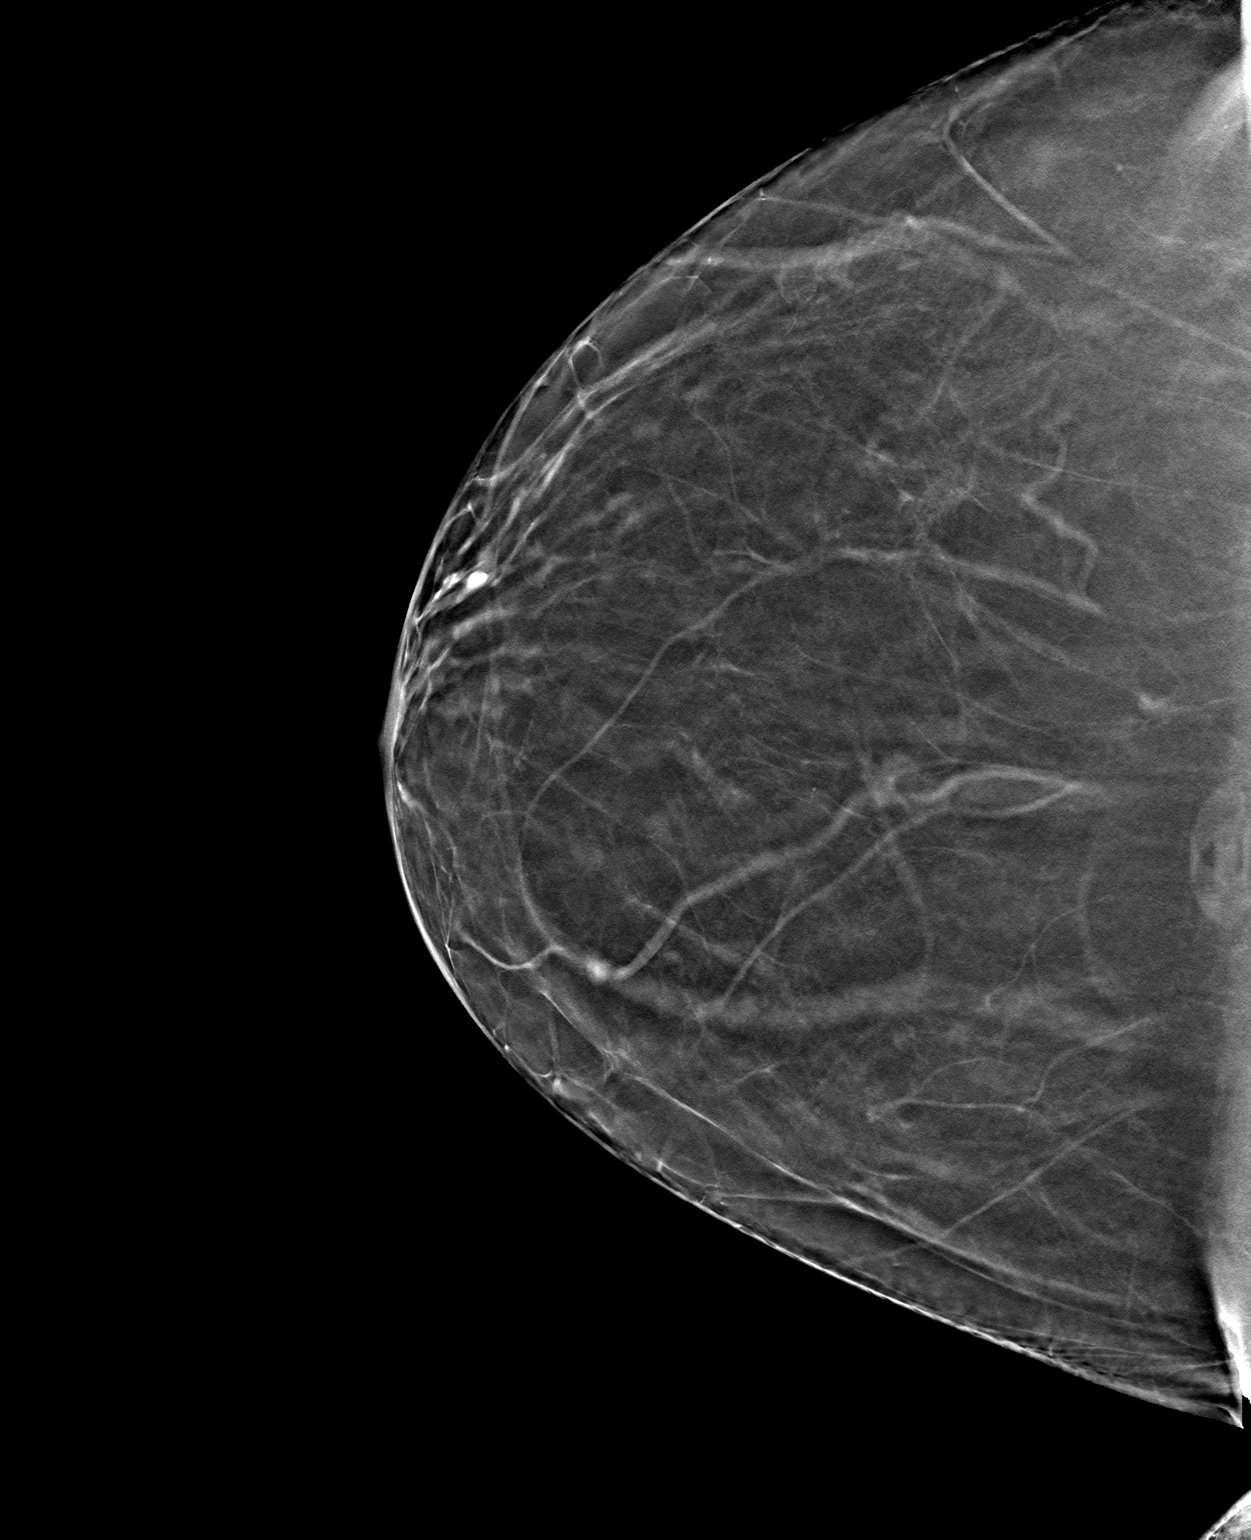

[1 of 5 positions shown; findings below may reference images not displayed]

ACR Breast Density Category b: There are scattered areas of
fibroglandular density.
FINDINGS: In the right breast, a possible asymmetry warrants further
evaluation. In the left breast, no findings suspicious for
malignancy. Images were processed with CAD.
IMPRESSION: Further evaluation is suggested for possible asymmetry in the right
breast.

RECOMMENDATION:
Diagnostic mammogram and possibly ultrasound of the right breast.
(Code:[id number])

The patient will be contacted regarding the findings, and additional
imaging will be scheduled.

BI-RADS CATEGORY  0: Incomplete. Need additional imaging evaluation
and/or prior mammograms for comparison.

## 2019-08-25 DIAGNOSIS — H02831 Dermatochalasis of right upper eyelid: Secondary | ICD-10-CM | POA: Diagnosis not present

## 2019-08-25 DIAGNOSIS — H02413 Mechanical ptosis of bilateral eyelids: Secondary | ICD-10-CM | POA: Diagnosis not present

## 2019-08-25 DIAGNOSIS — H57813 Brow ptosis, bilateral: Secondary | ICD-10-CM | POA: Diagnosis not present

## 2019-08-25 DIAGNOSIS — H02834 Dermatochalasis of left upper eyelid: Secondary | ICD-10-CM | POA: Diagnosis not present

## 2019-08-26 DIAGNOSIS — H53483 Generalized contraction of visual field, bilateral: Secondary | ICD-10-CM | POA: Diagnosis not present

## 2019-08-26 DIAGNOSIS — H53482 Generalized contraction of visual field, left eye: Secondary | ICD-10-CM | POA: Diagnosis not present

## 2019-08-26 DIAGNOSIS — H53481 Generalized contraction of visual field, right eye: Secondary | ICD-10-CM | POA: Diagnosis not present

## 2019-10-07 ENCOUNTER — Other Ambulatory Visit: Payer: Self-pay | Admitting: Family Medicine

## 2019-10-07 DIAGNOSIS — Z1231 Encounter for screening mammogram for malignant neoplasm of breast: Secondary | ICD-10-CM

## 2019-11-12 ENCOUNTER — Ambulatory Visit: Payer: BC Managed Care – PPO

## 2019-11-17 ENCOUNTER — Other Ambulatory Visit: Payer: Self-pay | Admitting: Ophthalmology

## 2019-11-17 DIAGNOSIS — H02834 Dermatochalasis of left upper eyelid: Secondary | ICD-10-CM | POA: Diagnosis not present

## 2019-11-17 DIAGNOSIS — D485 Neoplasm of uncertain behavior of skin: Secondary | ICD-10-CM | POA: Diagnosis not present

## 2019-11-17 DIAGNOSIS — H02831 Dermatochalasis of right upper eyelid: Secondary | ICD-10-CM | POA: Diagnosis not present

## 2019-11-17 DIAGNOSIS — D23121 Other benign neoplasm of skin of left upper eyelid, including canthus: Secondary | ICD-10-CM | POA: Diagnosis not present

## 2019-11-17 DIAGNOSIS — L821 Other seborrheic keratosis: Secondary | ICD-10-CM | POA: Diagnosis not present

## 2019-12-15 ENCOUNTER — Other Ambulatory Visit: Payer: Self-pay

## 2019-12-15 ENCOUNTER — Ambulatory Visit
Admission: RE | Admit: 2019-12-15 | Discharge: 2019-12-15 | Disposition: A | Discharge status: Home / Self Care | Payer: BC Managed Care – PPO | Source: Ambulatory Visit | Attending: Family Medicine | Admitting: Family Medicine

## 2019-12-15 DIAGNOSIS — Z1231 Encounter for screening mammogram for malignant neoplasm of breast: Secondary | ICD-10-CM | POA: Diagnosis not present

## 2020-01-05 ENCOUNTER — Ambulatory Visit: Payer: Self-pay

## 2020-01-13 DIAGNOSIS — L720 Epidermal cyst: Secondary | ICD-10-CM | POA: Diagnosis not present

## 2020-01-21 DIAGNOSIS — L720 Epidermal cyst: Secondary | ICD-10-CM | POA: Diagnosis not present

## 2020-02-16 ENCOUNTER — Other Ambulatory Visit: Payer: Self-pay

## 2020-02-16 MED ORDER — AMLODIPINE BESYLATE 5 MG PO TABS: ORAL_TABLET | ORAL | 0 refills | Status: DC

## 2020-02-16 NOTE — Telephone Encounter (Signed)
Pt  Is out of Amlodipine 5 mg; pt said Dr Deborra Medina decreased her Amlodipine from twice a day to once a day and pts BP has been doing well. Med list updated and amlodipine 5 mg taking one po daily # 90 sent to [facility] [place]. Pt scheduled New pt/TOC to Glenda Chroman FNP on 03/22/20. Pt will keep that appt and get med refills updated at that appt.

## 2020-03-22 ENCOUNTER — Other Ambulatory Visit: Payer: Self-pay

## 2020-03-22 ENCOUNTER — Ambulatory Visit (INDEPENDENT_AMBULATORY_CARE_PROVIDER_SITE_OTHER): Payer: BC Managed Care – PPO | Admitting: Family Medicine

## 2020-03-22 ENCOUNTER — Other Ambulatory Visit (HOSPITAL_COMMUNITY)
Admission: RE | Admit: 2020-03-22 | Discharge: 2020-03-22 | Disposition: A | Discharge status: Home / Self Care | Payer: BC Managed Care – PPO | Source: Ambulatory Visit | Attending: Family Medicine | Admitting: Family Medicine

## 2020-03-22 ENCOUNTER — Encounter: Payer: Self-pay | Admitting: Family Medicine

## 2020-03-22 VITALS — BP 130/60 | HR 72 | Temp 97.4°F | Ht 61.0 in | Wt 183.2 lb

## 2020-03-22 DIAGNOSIS — Z0000 Encounter for general adult medical examination without abnormal findings: Secondary | ICD-10-CM

## 2020-03-22 DIAGNOSIS — N72 Inflammatory disease of cervix uteri: Secondary | ICD-10-CM

## 2020-03-22 DIAGNOSIS — E6609 Other obesity due to excess calories: Secondary | ICD-10-CM

## 2020-03-22 DIAGNOSIS — Z6834 Body mass index (BMI) 34.0-34.9, adult: Secondary | ICD-10-CM | POA: Diagnosis not present

## 2020-03-22 DIAGNOSIS — Z124 Encounter for screening for malignant neoplasm of cervix: Secondary | ICD-10-CM | POA: Insufficient documentation

## 2020-03-22 DIAGNOSIS — E7800 Pure hypercholesterolemia, unspecified: Secondary | ICD-10-CM

## 2020-03-22 DIAGNOSIS — F17200 Nicotine dependence, unspecified, uncomplicated: Secondary | ICD-10-CM

## 2020-03-22 DIAGNOSIS — R87612 Low grade squamous intraepithelial lesion on cytologic smear of cervix (LGSIL): Secondary | ICD-10-CM

## 2020-03-22 DIAGNOSIS — B977 Papillomavirus as the cause of diseases classified elsewhere: Secondary | ICD-10-CM

## 2020-03-22 MED ORDER — AMLODIPINE BESYLATE 5 MG PO TABS: ORAL_TABLET | ORAL | 3 refills | Status: AC

## 2020-03-22 MED ORDER — CARVEDILOL 6.25 MG PO TABS: ORAL_TABLET | ORAL | 3 refills | Status: AC

## 2020-03-22 MED ORDER — CITALOPRAM HYDROBROMIDE 40 MG PO TABS: ORAL_TABLET | ORAL | 3 refills | Status: AC

## 2020-03-22 MED ORDER — PRAVASTATIN SODIUM 10 MG PO TABS: ORAL_TABLET | ORAL | 3 refills | Status: AC

## 2020-03-22 NOTE — Patient Instructions (Signed)
Good to see you today  Please follow up in 6 months  Keep up the good work with your weight loss  I will send you a mychart message with your labs/ pap results.

## 2020-03-22 NOTE — Progress Notes (Signed)
Subjective:    Patient ID: Kimberly Christian, female    DOB: [birth year], 64 y.o.   MRN: [id number]  HPI Chief Complaint  Patient presents with  . New Patient (Initial Visit)   This is a 64 yo female who presents today to establish care. Prior patient of Dr. Deborra Medina. She is retired. Was an Administrator for [employer]. Stays busy.    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 Better than it was.    Review of Systems  Constitutional: Negative.   HENT: Negative.   Eyes: Negative.   Respiratory: Negative.   Cardiovascular: Negative.   Endocrine: Negative.   Genitourinary: Negative.   Musculoskeletal: Negative.   Skin: Negative.   Allergic/Immunologic: Positive for environmental allergies.  Neurological: Negative.   Hematological: Negative.   Psychiatric/Behavioral: Negative.        Objective:   Physical Exam Physical Exam  Constitutional: She is oriented to person, place, and time. She appears well-developed and well-nourished. No distress.  HENT:  Head: Normocephalic and atraumatic.  Right Ear: External ear normal.  Left Ear: External ear normal.  Nose: Nose normal.  Mouth/Throat: Oropharynx is clear and moist. No oropharyngeal exudate.  Eyes: Conjunctivae are normal. .  Neck: Normal range of motion. Neck supple. No JVD present. No thyromegaly present.  Cardiovascular: Normal rate, regular rhythm, normal heart sounds and intact distal pulses.   Pulmonary/Chest: Effort normal and breath sounds normal. Right breast exhibits no inverted nipple, no mass, no nipple discharge, no skin change and no tenderness. Left breast exhibits no inverted nipple, no mass, no nipple discharge, no skin change and no tenderness. Breasts are symmetrical.  Abdominal: Soft. Bowel sounds are normal. She  exhibits no distension and no mass. There is no tenderness. There is no rebound and no guarding.  Genitourinary: Vagina normal. Pelvic exam was performed with patient supine. There is no rash, tenderness, lesion or injury on the right labia. There is no rash, tenderness, lesion or injury on the left labia. Cervix with small amount white discharge.  Musculoskeletal: Normal range of motion. She exhibits no edema or tenderness.  Lymphadenopathy:    She has no cervical adenopathy.  Neurological: She is alert and oriented to person, place, and time. She has normal reflexes.  Skin: Skin is warm and dry. She is not diaphoretic. Face, chest, arms, legs very tanned. She has many nevi (has annual dermatology appointment).  Psychiatric: She has a normal mood and affect. Her behavior is normal. Judgment and thought content normal.  Vitals reviewed.     BP 130/60 (BP Location: Left Arm, Patient Position: Sitting, Cuff Size: Normal)   Pulse 72   Temp (!) 97.4 F (36.3 C) (Temporal)   Ht 5\' 1"  (1.549 m)   Wt 183 lb 3 oz (83.1 kg)   SpO2 96%   BMI 34.61 kg/m  Wt Readings from Last 3 Encounters:  03/22/20 183 lb 3 oz (83.1 kg)  01/20/19 190 lb (86.2 kg)  03/05/18 195 lb (88.5 kg)       Assessment & Plan:  1. Annual physical exam - Discussed and encouraged healthy lifestyle choices- adequate sleep, regular exercise, stress management and healthy food choices.   2. Pure hypercholesterolemia - Lipid Panel - Comprehensive metabolic panel - CBC with Differential  3. Class 1 obesity due to excess calories with serious  comorbidity and body mass index (BMI) of 34.0 to 34.9 in adult - has been working on diet and has lost 12 pounds in last 2 years - Lipid Panel - Comprehensive metabolic panel - CBC with Differential - Vitamin D, 25-hydroxy  4. Screening for cervical cancer - Cytology - PAP([place])  5. TOBACCO ABUSE - had previously quit for one year on Chantix, offered to her again, she  felt that it made her gain weight. Discussed nicotine replacement and encouraged her to consider cessation.   - follow up in 6 months  This visit occurred during the SARS-CoV-2 public health emergency.  Safety protocols were in place, including screening questions prior to the visit, additional usage of staff PPE, and extensive cleaning of exam room while observing appropriate contact time as indicated for disinfecting solutions.      Clarene Reamer, FNP-BC  [facility]  03/22/2020 2:59 PM

## 2020-03-23 DIAGNOSIS — R87612 Low grade squamous intraepithelial lesion on cytologic smear of cervix (LGSIL): Secondary | ICD-10-CM | POA: Diagnosis not present

## 2020-03-23 LAB — CBC WITH DIFFERENTIAL/PLATELET
Basophils Absolute: 0.1 K/uL (ref 0.0–0.1)
Basophils Relative: 1.4 % (ref 0.0–3.0)
Eosinophils Absolute: 0.2 K/uL (ref 0.0–0.7)
Eosinophils Relative: 2.5 % (ref 0.0–5.0)
HCT: 43.7 % (ref 36.0–46.0)
Hemoglobin: 15 g/dL (ref 12.0–15.0)
Lymphocytes Relative: 28.8 % (ref 12.0–46.0)
Lymphs Abs: 2.1 K/uL (ref 0.7–4.0)
MCHC: 34.4 g/dL (ref 30.0–36.0)
MCV: 103.1 fl — ABNORMAL HIGH (ref 78.0–100.0)
Monocytes Absolute: 0.6 K/uL (ref 0.1–1.0)
Monocytes Relative: 7.8 % (ref 3.0–12.0)
Neutro Abs: 4.4 K/uL (ref 1.4–7.7)
Neutrophils Relative %: 59.5 % (ref 43.0–77.0)
Platelets: 196 K/uL (ref 150.0–400.0)
RBC: 4.23 Mil/uL (ref 3.87–5.11)
RDW: 13.2 % (ref 11.5–15.5)
WBC: 7.4 K/uL (ref 4.0–10.5)

## 2020-03-23 LAB — LIPID PANEL
Cholesterol: 194 mg/dL (ref 0–200)
HDL: 60.3 mg/dL
LDL Cholesterol: 110 mg/dL — ABNORMAL HIGH (ref 0–99)
NonHDL: 133.45
Total CHOL/HDL Ratio: 3
Triglycerides: 119 mg/dL (ref 0.0–149.0)
VLDL: 23.8 mg/dL (ref 0.0–40.0)

## 2020-03-23 LAB — COMPREHENSIVE METABOLIC PANEL WITH GFR
ALT: 18 U/L (ref 0–35)
AST: 22 U/L (ref 0–37)
Albumin: 4 g/dL (ref 3.5–5.2)
Alkaline Phosphatase: 67 U/L (ref 39–117)
BUN: 8 mg/dL (ref 6–23)
CO2: 29 meq/L (ref 19–32)
Calcium: 9 mg/dL (ref 8.4–10.5)
Chloride: 100 meq/L (ref 96–112)
Creatinine, Ser: 0.84 mg/dL (ref 0.40–1.20)
GFR: 68.63 mL/min
Glucose, Bld: 100 mg/dL — ABNORMAL HIGH (ref 70–99)
Potassium: 4 meq/L (ref 3.5–5.1)
Sodium: 137 meq/L (ref 135–145)
Total Bilirubin: 0.5 mg/dL (ref 0.2–1.2)
Total Protein: 6.6 g/dL (ref 6.0–8.3)

## 2020-03-23 LAB — VITAMIN D 25 HYDROXY (VIT D DEFICIENCY, FRACTURES): VITD: 25.38 ng/mL — ABNORMAL LOW (ref 30.00–100.00)

## 2020-03-26 LAB — CYTOLOGY - PAP
Comment: NEGATIVE
Comment: NEGATIVE
HPV 16: NEGATIVE
HPV 18 / 45: NEGATIVE
High risk HPV: POSITIVE — AB

## 2020-03-26 NOTE — Addendum Note (Signed)
Addended by: Clarene Reamer B on: 03/26/2020 05:12 PM   Modules accepted: Orders

## 2020-03-31 ENCOUNTER — Telehealth: Payer: Self-pay

## 2020-03-31 NOTE — Telephone Encounter (Signed)
Referral was faxed to [facility] clinic in [place], patient was notified that they will call her to schedule.

## 2020-03-31 NOTE — Telephone Encounter (Signed)
Pt left v/m requesting cb about GYN referral; pt wants to know if we will schedule or does pt need to do that. FYI to [facility].

## 2020-04-13 DIAGNOSIS — N870 Mild cervical dysplasia: Secondary | ICD-10-CM | POA: Diagnosis not present

## 2020-04-13 DIAGNOSIS — Z6834 Body mass index (BMI) 34.0-34.9, adult: Secondary | ICD-10-CM | POA: Diagnosis not present

## 2020-04-13 DIAGNOSIS — B977 Papillomavirus as the cause of diseases classified elsewhere: Secondary | ICD-10-CM | POA: Diagnosis not present

## 2020-04-13 DIAGNOSIS — N951 Menopausal and female climacteric states: Secondary | ICD-10-CM | POA: Diagnosis not present

## 2020-04-13 DIAGNOSIS — R87612 Low grade squamous intraepithelial lesion on cytologic smear of cervix (LGSIL): Secondary | ICD-10-CM | POA: Diagnosis not present

## 2020-05-06 DIAGNOSIS — N870 Mild cervical dysplasia: Secondary | ICD-10-CM | POA: Diagnosis not present

## 2020-05-06 DIAGNOSIS — Z6834 Body mass index (BMI) 34.0-34.9, adult: Secondary | ICD-10-CM | POA: Diagnosis not present

## 2021-04-06 DIAGNOSIS — E6609 Other obesity due to excess calories: Secondary | ICD-10-CM | POA: Diagnosis not present

## 2021-04-06 DIAGNOSIS — F17210 Nicotine dependence, cigarettes, uncomplicated: Secondary | ICD-10-CM | POA: Diagnosis not present

## 2021-04-06 DIAGNOSIS — Z6831 Body mass index (BMI) 31.0-31.9, adult: Secondary | ICD-10-CM | POA: Diagnosis not present

## 2021-04-06 DIAGNOSIS — N951 Menopausal and female climacteric states: Secondary | ICD-10-CM | POA: Diagnosis not present

## 2021-04-06 DIAGNOSIS — F1020 Alcohol dependence, uncomplicated: Secondary | ICD-10-CM | POA: Diagnosis not present

## 2021-04-06 DIAGNOSIS — Z0001 Encounter for general adult medical examination with abnormal findings: Secondary | ICD-10-CM | POA: Diagnosis not present

## 2021-04-06 DIAGNOSIS — I10 Essential (primary) hypertension: Secondary | ICD-10-CM | POA: Diagnosis not present

## 2021-04-06 DIAGNOSIS — E785 Hyperlipidemia, unspecified: Secondary | ICD-10-CM | POA: Diagnosis not present

## 2021-04-06 DIAGNOSIS — F324 Major depressive disorder, single episode, in partial remission: Secondary | ICD-10-CM | POA: Diagnosis not present

## 2021-04-06 DIAGNOSIS — Z1231 Encounter for screening mammogram for malignant neoplasm of breast: Secondary | ICD-10-CM | POA: Diagnosis not present

## 2021-04-10 IMAGING — MG DIGITAL SCREENING BILAT W/ TOMO W/ CAD
6 of 10 series · 6 of 30 positions shown · non-contrast
Comparison: Previous exam(s).

CLINICAL DATA: Screening.

EXAM:
DIGITAL SCREENING BILATERAL MAMMOGRAM WITH TOMO AND CAD

[L MLO synth-2D (1 of 2)]
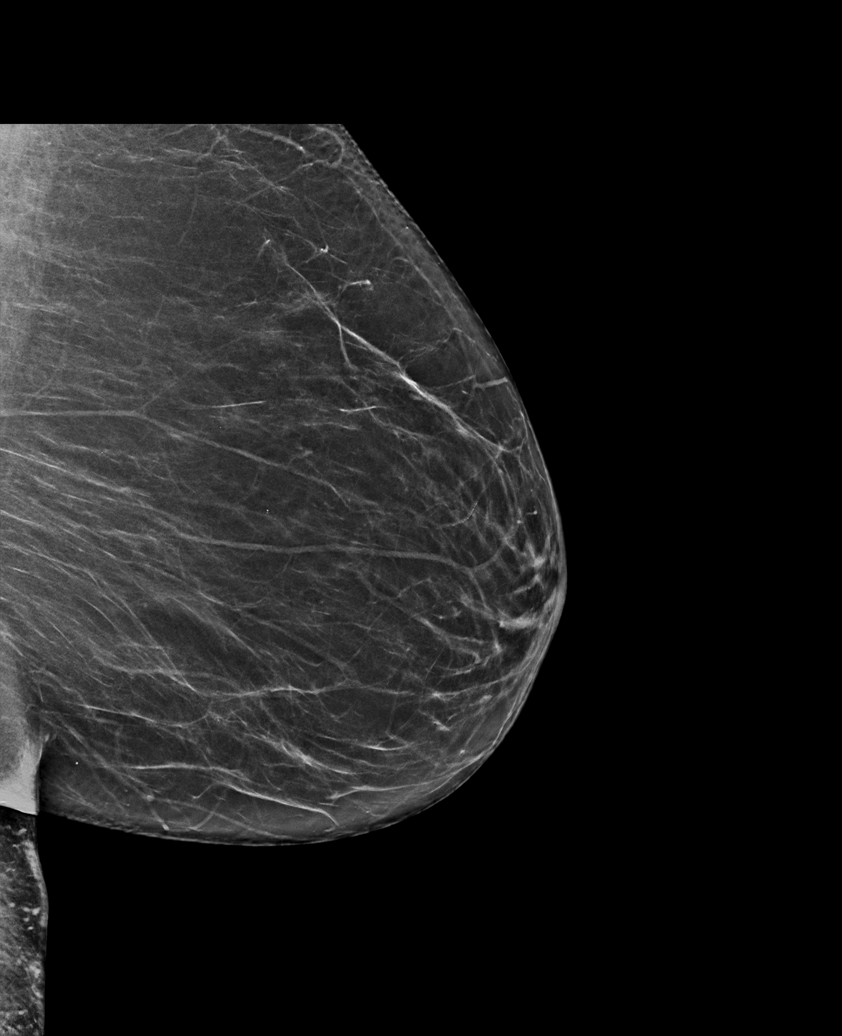

[R CC synth-2D]
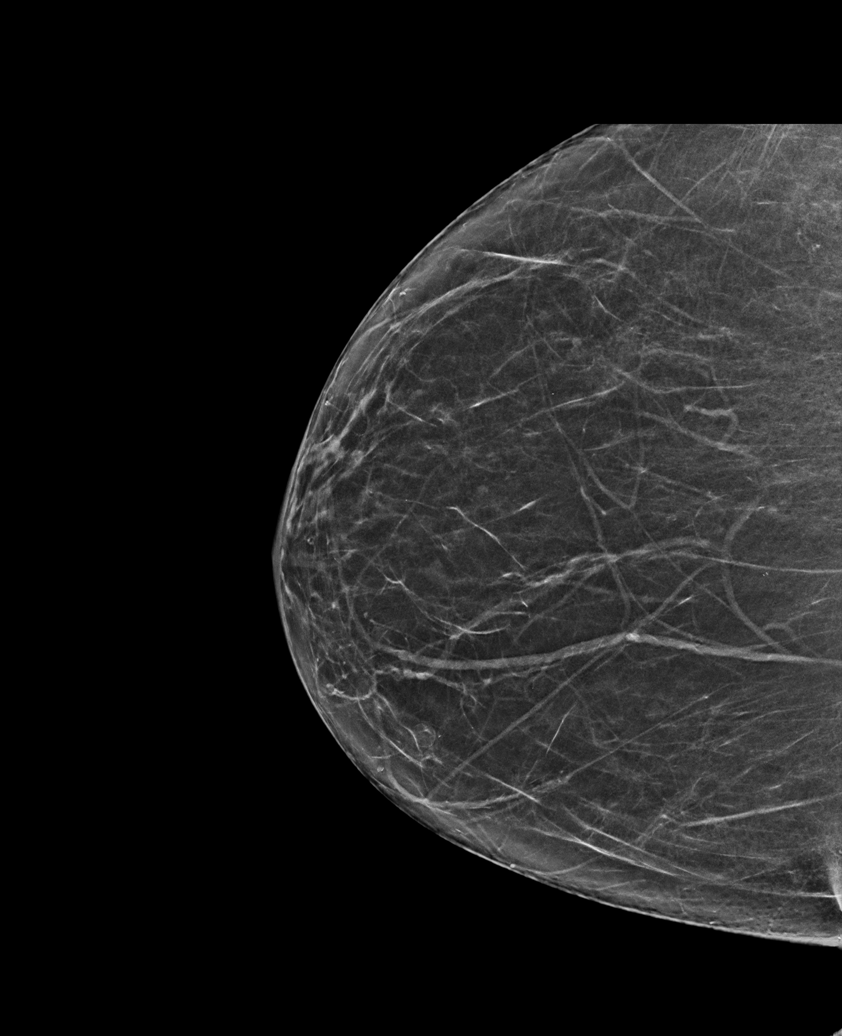

[R MLO synth-2D]
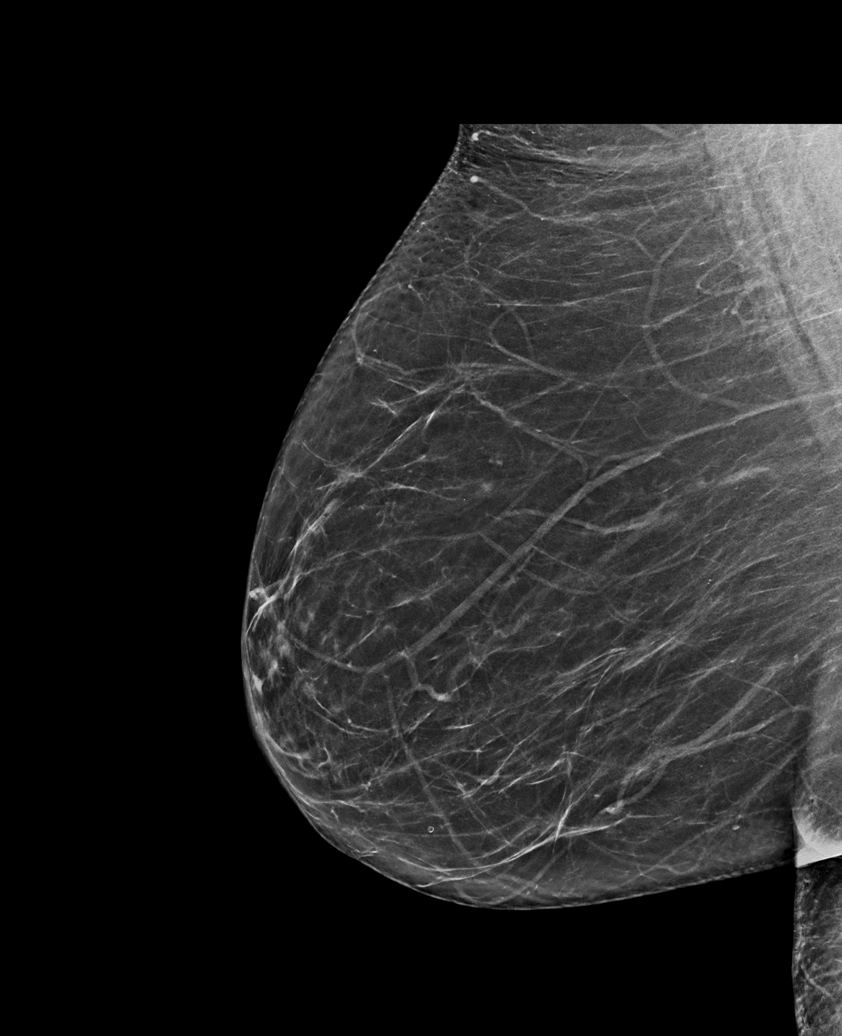

[L MLO synth-2D (2 of 2)]
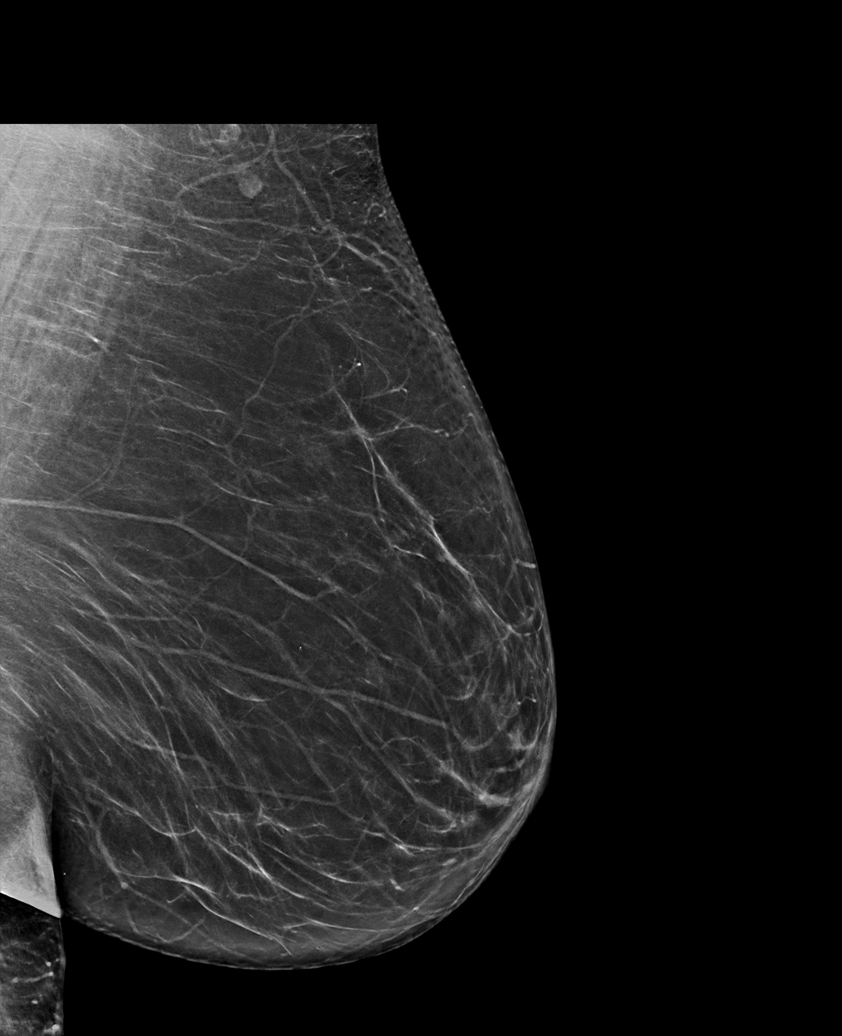

[L CC synth-2D]
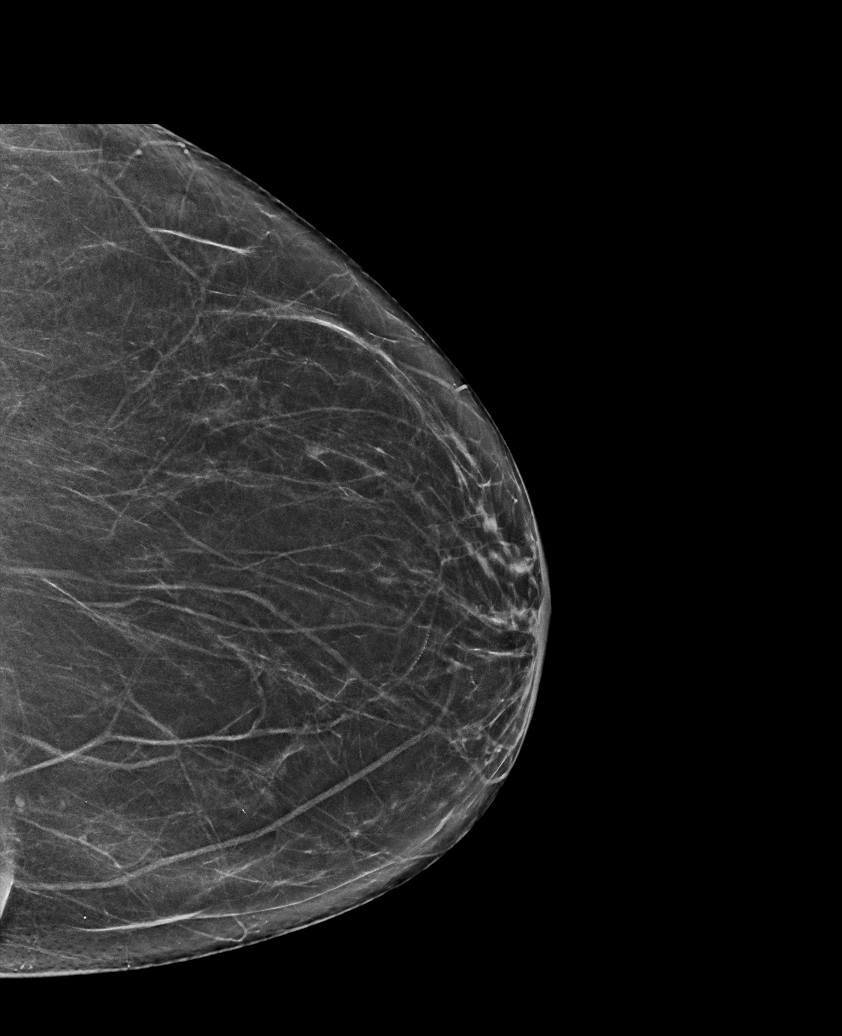

[L MLO tomo · tomo slice 33/66.0]
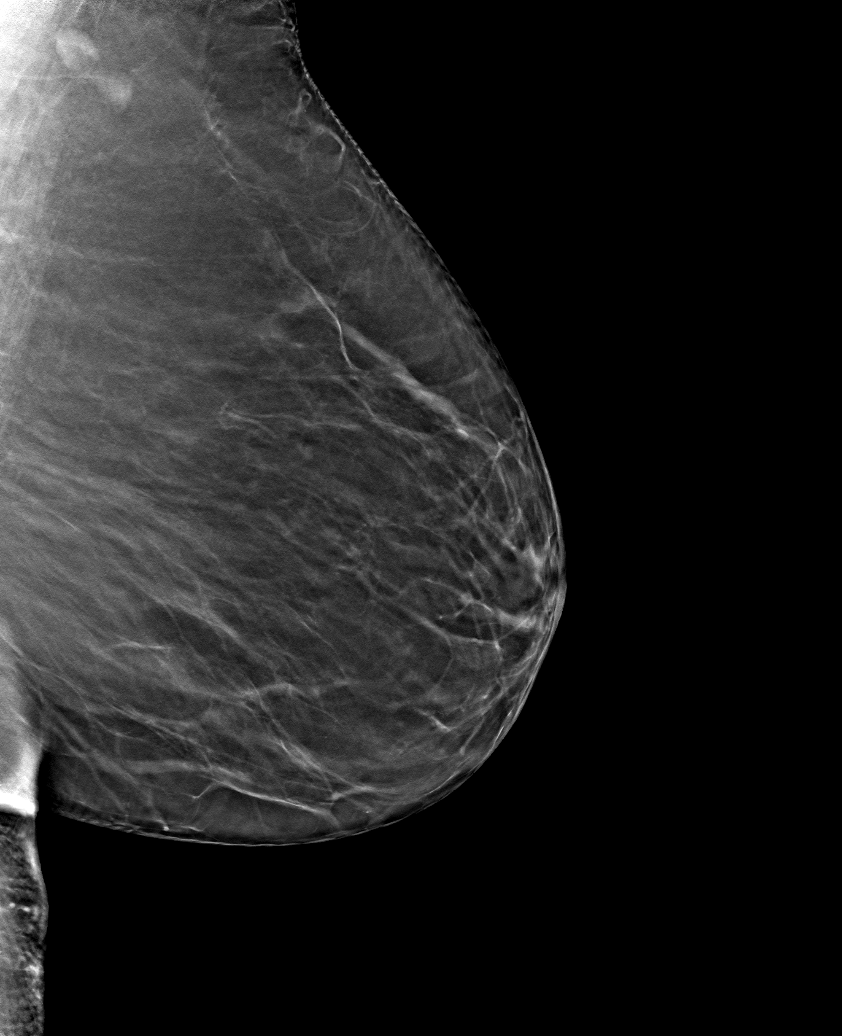

[6 of 30 positions shown; findings below may reference images not displayed]

ACR Breast Density Category b: There are scattered areas of
fibroglandular density.
FINDINGS: There are no findings suspicious for malignancy. Images were
processed with CAD.
IMPRESSION: No mammographic evidence of malignancy. A result letter of this
screening mammogram will be mailed directly to the patient.

RECOMMENDATION:
Screening mammogram in one year. (Code:[id number])

BI-RADS CATEGORY  1: Negative.

## 2021-04-13 DIAGNOSIS — E785 Hyperlipidemia, unspecified: Secondary | ICD-10-CM | POA: Diagnosis not present

## 2021-04-13 DIAGNOSIS — I10 Essential (primary) hypertension: Secondary | ICD-10-CM | POA: Diagnosis not present

## 2021-04-21 ENCOUNTER — Other Ambulatory Visit: Payer: Self-pay

## 2021-04-28 DIAGNOSIS — I10 Essential (primary) hypertension: Secondary | ICD-10-CM | POA: Diagnosis not present

## 2021-04-28 DIAGNOSIS — E785 Hyperlipidemia, unspecified: Secondary | ICD-10-CM | POA: Diagnosis not present

## 2021-04-28 DIAGNOSIS — E876 Hypokalemia: Secondary | ICD-10-CM | POA: Diagnosis not present

## 2021-04-28 DIAGNOSIS — F324 Major depressive disorder, single episode, in partial remission: Secondary | ICD-10-CM | POA: Diagnosis not present

## 2021-05-04 DIAGNOSIS — E876 Hypokalemia: Secondary | ICD-10-CM | POA: Diagnosis not present

## 2021-05-05 DIAGNOSIS — I10 Essential (primary) hypertension: Secondary | ICD-10-CM | POA: Diagnosis not present

## 2021-05-13 DIAGNOSIS — Z1231 Encounter for screening mammogram for malignant neoplasm of breast: Secondary | ICD-10-CM | POA: Diagnosis not present

## 2021-05-13 DIAGNOSIS — N951 Menopausal and female climacteric states: Secondary | ICD-10-CM | POA: Diagnosis not present

## 2021-05-26 ENCOUNTER — Other Ambulatory Visit: Payer: Self-pay

## 2021-05-26 NOTE — Telephone Encounter (Signed)
Left message for patient to call back and schedule TOC since Tor Netters is no longer here.   Refill request from [place] in [facility] in the system. Last refilled: 03/22/20 #90 with 3 refills   LOV 03/22/20 CPE No future appointments made.

## 2021-08-01 DIAGNOSIS — E785 Hyperlipidemia, unspecified: Secondary | ICD-10-CM | POA: Diagnosis not present

## 2021-08-01 DIAGNOSIS — I10 Essential (primary) hypertension: Secondary | ICD-10-CM | POA: Diagnosis not present

## 2021-08-01 DIAGNOSIS — E876 Hypokalemia: Secondary | ICD-10-CM | POA: Diagnosis not present

## 2021-08-01 DIAGNOSIS — F324 Major depressive disorder, single episode, in partial remission: Secondary | ICD-10-CM | POA: Diagnosis not present

## 2021-08-29 DIAGNOSIS — F1020 Alcohol dependence, uncomplicated: Secondary | ICD-10-CM | POA: Diagnosis not present

## 2021-08-29 DIAGNOSIS — E876 Hypokalemia: Secondary | ICD-10-CM | POA: Diagnosis not present

## 2021-08-29 DIAGNOSIS — R0683 Snoring: Secondary | ICD-10-CM | POA: Diagnosis not present

## 2021-08-29 DIAGNOSIS — I10 Essential (primary) hypertension: Secondary | ICD-10-CM | POA: Diagnosis not present

## 2021-10-06 DIAGNOSIS — I10 Essential (primary) hypertension: Secondary | ICD-10-CM | POA: Diagnosis not present

## 2021-10-06 DIAGNOSIS — E785 Hyperlipidemia, unspecified: Secondary | ICD-10-CM | POA: Diagnosis not present

## 2021-10-24 DIAGNOSIS — R0683 Snoring: Secondary | ICD-10-CM | POA: Diagnosis not present

## 2021-10-24 DIAGNOSIS — I10 Essential (primary) hypertension: Secondary | ICD-10-CM | POA: Diagnosis not present

## 2021-10-24 DIAGNOSIS — E785 Hyperlipidemia, unspecified: Secondary | ICD-10-CM | POA: Diagnosis not present

## 2022-04-04 DIAGNOSIS — I10 Essential (primary) hypertension: Secondary | ICD-10-CM | POA: Diagnosis not present

## 2022-04-04 DIAGNOSIS — F1020 Alcohol dependence, uncomplicated: Secondary | ICD-10-CM | POA: Diagnosis not present

## 2022-04-04 DIAGNOSIS — E785 Hyperlipidemia, unspecified: Secondary | ICD-10-CM | POA: Diagnosis not present

## 2022-04-11 DIAGNOSIS — Z1331 Encounter for screening for depression: Secondary | ICD-10-CM | POA: Diagnosis not present

## 2022-04-11 DIAGNOSIS — E785 Hyperlipidemia, unspecified: Secondary | ICD-10-CM | POA: Diagnosis not present

## 2022-04-11 DIAGNOSIS — F1020 Alcohol dependence, uncomplicated: Secondary | ICD-10-CM | POA: Diagnosis not present

## 2022-04-11 DIAGNOSIS — E6609 Other obesity due to excess calories: Secondary | ICD-10-CM | POA: Diagnosis not present

## 2022-04-11 DIAGNOSIS — Z6833 Body mass index (BMI) 33.0-33.9, adult: Secondary | ICD-10-CM | POA: Diagnosis not present

## 2022-04-11 DIAGNOSIS — I10 Essential (primary) hypertension: Secondary | ICD-10-CM | POA: Diagnosis not present

## 2022-04-11 DIAGNOSIS — M25552 Pain in left hip: Secondary | ICD-10-CM | POA: Diagnosis not present

## 2022-04-11 DIAGNOSIS — F17210 Nicotine dependence, cigarettes, uncomplicated: Secondary | ICD-10-CM | POA: Diagnosis not present

## 2022-04-11 DIAGNOSIS — N393 Stress incontinence (female) (male): Secondary | ICD-10-CM | POA: Diagnosis not present

## 2022-04-11 DIAGNOSIS — N951 Menopausal and female climacteric states: Secondary | ICD-10-CM | POA: Diagnosis not present

## 2022-04-11 DIAGNOSIS — Z0001 Encounter for general adult medical examination with abnormal findings: Secondary | ICD-10-CM | POA: Diagnosis not present

## 2022-04-11 DIAGNOSIS — Z1231 Encounter for screening mammogram for malignant neoplasm of breast: Secondary | ICD-10-CM | POA: Diagnosis not present

## 2022-05-02 DIAGNOSIS — R2689 Other abnormalities of gait and mobility: Secondary | ICD-10-CM | POA: Diagnosis not present

## 2022-05-02 DIAGNOSIS — F1020 Alcohol dependence, uncomplicated: Secondary | ICD-10-CM | POA: Diagnosis not present

## 2022-05-02 DIAGNOSIS — I10 Essential (primary) hypertension: Secondary | ICD-10-CM | POA: Diagnosis not present

## 2022-05-02 DIAGNOSIS — M25552 Pain in left hip: Secondary | ICD-10-CM | POA: Diagnosis not present

## 2022-05-08 DIAGNOSIS — F1020 Alcohol dependence, uncomplicated: Secondary | ICD-10-CM | POA: Diagnosis not present

## 2022-05-08 DIAGNOSIS — I10 Essential (primary) hypertension: Secondary | ICD-10-CM | POA: Diagnosis not present

## 2022-05-08 DIAGNOSIS — M25552 Pain in left hip: Secondary | ICD-10-CM | POA: Diagnosis not present

## 2022-05-08 DIAGNOSIS — R2689 Other abnormalities of gait and mobility: Secondary | ICD-10-CM | POA: Diagnosis not present

## 2022-05-09 DIAGNOSIS — R2689 Other abnormalities of gait and mobility: Secondary | ICD-10-CM | POA: Diagnosis not present

## 2022-05-09 DIAGNOSIS — F1020 Alcohol dependence, uncomplicated: Secondary | ICD-10-CM | POA: Diagnosis not present

## 2022-05-09 DIAGNOSIS — M25552 Pain in left hip: Secondary | ICD-10-CM | POA: Diagnosis not present

## 2022-05-09 DIAGNOSIS — I10 Essential (primary) hypertension: Secondary | ICD-10-CM | POA: Diagnosis not present

## 2022-05-11 DIAGNOSIS — R2689 Other abnormalities of gait and mobility: Secondary | ICD-10-CM | POA: Diagnosis not present

## 2022-05-11 DIAGNOSIS — I10 Essential (primary) hypertension: Secondary | ICD-10-CM | POA: Diagnosis not present

## 2022-05-11 DIAGNOSIS — F1020 Alcohol dependence, uncomplicated: Secondary | ICD-10-CM | POA: Diagnosis not present

## 2022-05-11 DIAGNOSIS — M25552 Pain in left hip: Secondary | ICD-10-CM | POA: Diagnosis not present

## 2022-05-15 DIAGNOSIS — M25552 Pain in left hip: Secondary | ICD-10-CM | POA: Diagnosis not present

## 2022-05-15 DIAGNOSIS — R2689 Other abnormalities of gait and mobility: Secondary | ICD-10-CM | POA: Diagnosis not present

## 2022-05-15 DIAGNOSIS — I10 Essential (primary) hypertension: Secondary | ICD-10-CM | POA: Diagnosis not present

## 2022-05-15 DIAGNOSIS — F1020 Alcohol dependence, uncomplicated: Secondary | ICD-10-CM | POA: Diagnosis not present

## 2022-05-17 DIAGNOSIS — F1020 Alcohol dependence, uncomplicated: Secondary | ICD-10-CM | POA: Diagnosis not present

## 2022-05-17 DIAGNOSIS — I10 Essential (primary) hypertension: Secondary | ICD-10-CM | POA: Diagnosis not present

## 2022-05-17 DIAGNOSIS — R2689 Other abnormalities of gait and mobility: Secondary | ICD-10-CM | POA: Diagnosis not present

## 2022-05-17 DIAGNOSIS — M25552 Pain in left hip: Secondary | ICD-10-CM | POA: Diagnosis not present

## 2022-05-19 DIAGNOSIS — E785 Hyperlipidemia, unspecified: Secondary | ICD-10-CM | POA: Diagnosis not present

## 2022-05-19 DIAGNOSIS — I10 Essential (primary) hypertension: Secondary | ICD-10-CM | POA: Diagnosis not present

## 2022-05-19 DIAGNOSIS — F1020 Alcohol dependence, uncomplicated: Secondary | ICD-10-CM | POA: Diagnosis not present

## 2022-05-22 DIAGNOSIS — Z01419 Encounter for gynecological examination (general) (routine) without abnormal findings: Secondary | ICD-10-CM | POA: Diagnosis not present

## 2022-05-24 DIAGNOSIS — R87612 Low grade squamous intraepithelial lesion on cytologic smear of cervix (LGSIL): Secondary | ICD-10-CM | POA: Diagnosis not present

## 2022-05-24 DIAGNOSIS — Z01419 Encounter for gynecological examination (general) (routine) without abnormal findings: Secondary | ICD-10-CM | POA: Diagnosis not present

## 2022-06-27 DIAGNOSIS — Z1231 Encounter for screening mammogram for malignant neoplasm of breast: Secondary | ICD-10-CM | POA: Diagnosis not present
